# Patient Record
Sex: Male | Born: 1990 | Race: Black or African American | Hispanic: No | Marital: Single | Smoking: Never smoker
Health system: Southern US, Community
[De-identification: ages and names within clinical notes are randomized; demographics above are authoritative.]

## PROBLEM LIST (undated history)

## (undated) DIAGNOSIS — J45909 Unspecified asthma, uncomplicated: Secondary | ICD-10-CM

---
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
## 2010-07-21 ENCOUNTER — Emergency Department (HOSPITAL_COMMUNITY): Admission: EM | Admit: 2010-07-21 | Discharge: 2010-07-21 | Payer: Self-pay | Admitting: Emergency Medicine

## 2011-01-26 NOTE — Procedures (Signed)
Edward Melendez, Edward Melendez                  ACCOUNT NO.:  [id number]   MEDICAL RECORD NO.:  [id number]          PATIENT TYPE:  OUT   LOCATION:  SLEEP CENTER                 FACILITY:  [facility]   PHYSICIAN:  Clinton D. Maple Hudson, M.D. DATE OF BIRTH:  February 20, 1991   DATE OF STUDY:  06/23/2004                              NOCTURNAL POLYSOMNOGRAM   STUDY DATE:  June 23, 2004   REFERRING PHYSICIAN:  Talmadge Coventry, M.D.   INDICATION FOR THE STUDY:  Insomnia with sleep apnea.  Complaints of waking  up early in the morning and coughing in sleep.  Sleep talking.   EPWORTH SLEEPINESS SCORE:  8/24   BODY MASS INDEX:  31.9   WEIGHT:  176 pounds (age 20)   NECK SIZE:  14 inches   SLEEP ARCHITECTURE:  Pediatric scoring criteria were used.  Total sleep time  365 minutes with sleep efficiency 70%.  Stage I was absent, stage II was  34%, stages III and IV were 58%, appropriate for age, REM was 8% of total  sleep time, sleep latency was 52 minutes, REM latency was 218 minutes, awake  after sleep onset 82 minutes, arousal index 9.5.   RESPIRATORY DATA:  RDI 3/hr reflecting 18 hypopneas.  This may be borderline  abnormal at age 68, depending on clinical context, but is most likely within  normal limits.  Events were not positional.  REM RDI was absent.   OXYGEN DATA:  Loud snoring with oxygen desaturation indicated once at 39%  which is an artifact.  Mean oxygen saturation through the study was 97-98%  on room air.   CARDIAC DATA:  Normal sinus rhythm.   MOVEMENT/PARASOMNIA:  Bathroom x1.  A total of 108 limb jerks were recorded  of which 6 were associated with arousal or awakening for a periodic limb  movement with arousal index of 1/hr which is unlikely to be significant.  Technician noted that patient tossed and turned a lot during the study.   IMPRESSION/RECOMMENDATION:  A respiratory disturbance index of 3/hr is  probably within normal limits at age 73.  However, in association with loud  snoring, despite normal oxygenation, the possibility of upper airway  resistance syndrome (subclinical sleep apnea type interference with sleep)  is not excluded.  Continuous positive airway pressure is not usually  provided for an respiratory disturbance index in this range but attention to  good sleep  hygiene, weight loss and evaluation for upper airway obstruction/allergic  rhinitis and tonsil hypertrophy may be indicated.      CDY/MEDQ  D:  06/25/2004 13:52:49  T:  06/26/2004 40:98:11  Job:  [id number]

## 2011-01-26 NOTE — Op Note (Signed)
NAMEMJ, WILLIS                  ACCOUNT NO.:  [id number]   MEDICAL RECORD NO.:  [id number]          PATIENT TYPE:  AMB   LOCATION:  DSC                          FACILITY:  [facility]   PHYSICIAN:  Christopher E. Ezzard Standing, M.D.DATE OF BIRTH:  1991-09-03   DATE OF PROCEDURE:  08/29/2004  DATE OF DISCHARGE:                                 OPERATIVE REPORT   PREOPERATIVE DIAGNOSIS:  Adenotonsillar hypertrophy with obstructive  symptoms.   POSTOPERATIVE DIAGNOSIS:  Adenotonsillar hypertrophy with obstructive  symptoms.   OPERATION:  Tonsillectomy and adenoidectomy.   SURGEON:  Kristine Garbe. Ezzard Standing, M.D.   ANESTHESIA:  General.   COMPLICATIONS:  None.   BRIEF CLINICAL NOTE:  Jovan is a 20 year old male with poor sleeping with  snoring.  He underwent a sleep test which did not demonstrate sleep apnea  but was consistent with upper airway resistance syndrome.  On examination,  he has moderate size 2+ tonsils with moderate size adenoids.  He is taken to  the operating room at this time for tonsillectomy and adenoidectomy to  improve his upper airway obstruction.   DESCRIPTION OF PROCEDURE:  After adequate endotracheal anesthesia, the  patient received 1 gram Ancef IV preoperatively as well as 10 mg Decadron IV  preoperatively.  The mouth gag was used to expose the oropharynx.  Sharmarke  has large embedded tonsils bilaterally. Using cautery, the tonsils were  resected from the tonsillar fossae.  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  After obtaining adequate hemostasis, a red rubber catheter was passed  through the nose and out the mouth to retract the soft palate.  The  nasopharynx was examined.  Using an adenoid  curet, the central pad of  adenoid tissue was removed.  Hemostasis was obtained with suction cautery.  After obtaining adequate hemostasis, the nasopharynx was irrigated with  saline.  Of note, Edword had swollen turbinates consistent with rhinitis  causing partial nasal  obstruction.  After obtaining adequate hemostasis, the  procedure was completed.  Jacqueline was awakened from anesthesia and  transferred to the recovery room postop doing well.   DISPOSITION:  Hodari will be observed overnight in the recovery care center  and discharged home in the morning on Amoxicillin suspension 500 mg b.i.d.  for one week, Tylenol Lortab Elixir, 2-3 tsp q.4h. p.r.n. pain.  He is to  follow up in my office in two weeks for recheck.       CEN/MEDQ  D:  08/29/2004  T:  08/29/2004  Job:  [id number]   cc:   Talmadge Coventry, M.D.  [address]  [place]  [place] [address]  Fax: [phone]

## 2011-05-23 ENCOUNTER — Inpatient Hospital Stay (INDEPENDENT_AMBULATORY_CARE_PROVIDER_SITE_OTHER)
Admission: RE | Admit: 2011-05-23 | Discharge: 2011-05-23 | Disposition: A | Discharge status: Home / Self Care | Payer: Medicaid Other | Source: Ambulatory Visit | Attending: Family Medicine | Admitting: Family Medicine

## 2011-05-23 DIAGNOSIS — D1039 Benign neoplasm of other parts of mouth: Secondary | ICD-10-CM

## 2013-01-01 ENCOUNTER — Emergency Department (HOSPITAL_COMMUNITY)
Admission: EM | Admit: 2013-01-01 | Discharge: 2013-01-01 | Disposition: A | Discharge status: Home / Self Care | Payer: No Typology Code available for payment source | Attending: Emergency Medicine | Admitting: Emergency Medicine

## 2013-01-01 ENCOUNTER — Emergency Department (HOSPITAL_COMMUNITY): Payer: No Typology Code available for payment source

## 2013-01-01 ENCOUNTER — Encounter (HOSPITAL_COMMUNITY): Payer: Self-pay | Admitting: Emergency Medicine

## 2013-01-01 DIAGNOSIS — M542 Cervicalgia: Secondary | ICD-10-CM

## 2013-01-01 DIAGNOSIS — J45909 Unspecified asthma, uncomplicated: Secondary | ICD-10-CM | POA: Insufficient documentation

## 2013-01-01 DIAGNOSIS — Y939 Activity, unspecified: Secondary | ICD-10-CM | POA: Insufficient documentation

## 2013-01-01 DIAGNOSIS — IMO0002 Reserved for concepts with insufficient information to code with codable children: Secondary | ICD-10-CM | POA: Insufficient documentation

## 2013-01-01 DIAGNOSIS — S0993XA Unspecified injury of face, initial encounter: Secondary | ICD-10-CM | POA: Insufficient documentation

## 2013-01-01 DIAGNOSIS — Y92410 Unspecified street and highway as the place of occurrence of the external cause: Secondary | ICD-10-CM | POA: Insufficient documentation

## 2013-01-01 DIAGNOSIS — M545 Low back pain: Secondary | ICD-10-CM

## 2013-01-01 DIAGNOSIS — S199XXA Unspecified injury of neck, initial encounter: Secondary | ICD-10-CM | POA: Insufficient documentation

## 2013-01-01 HISTORY — DX: Unspecified asthma, uncomplicated: J45.909
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
## 2013-05-30 ENCOUNTER — Emergency Department (HOSPITAL_COMMUNITY)
Admission: EM | Admit: 2013-05-30 | Discharge: 2013-05-30 | Disposition: A | Discharge status: Home / Self Care | Payer: Medicaid Other | Attending: Emergency Medicine | Admitting: Emergency Medicine

## 2013-05-30 ENCOUNTER — Encounter (HOSPITAL_COMMUNITY): Payer: Self-pay | Admitting: Emergency Medicine

## 2013-05-30 DIAGNOSIS — J3489 Other specified disorders of nose and nasal sinuses: Secondary | ICD-10-CM | POA: Insufficient documentation

## 2013-05-30 DIAGNOSIS — IMO0001 Reserved for inherently not codable concepts without codable children: Secondary | ICD-10-CM | POA: Insufficient documentation

## 2013-05-30 DIAGNOSIS — J45909 Unspecified asthma, uncomplicated: Secondary | ICD-10-CM | POA: Insufficient documentation

## 2013-05-30 DIAGNOSIS — R509 Fever, unspecified: Secondary | ICD-10-CM | POA: Insufficient documentation

## 2013-05-30 DIAGNOSIS — J069 Acute upper respiratory infection, unspecified: Secondary | ICD-10-CM

## 2013-05-30 DIAGNOSIS — R0602 Shortness of breath: Secondary | ICD-10-CM | POA: Insufficient documentation

## 2013-05-30 MED ORDER — IBUPROFEN 200 MG PO TABS
600.0000 mg | ORAL_TABLET | Freq: Once | ORAL | Status: AC
  Administered 2013-05-30: 600 mg via ORAL
  Filled 2013-05-30: qty 3

## 2013-05-30 NOTE — ED Notes (Signed)
Symptoms started yesterday, sudden onset with leg/back pain, fever, chills, near syncope yesterday, intermittent sob. Denies CP, weakness.Temp 99.5 in room. In not distress. Lungs clear. Sinus Tachy on monitor.

## 2013-05-30 NOTE — ED Notes (Signed)
Pt c/o of cough, dizziness, and fever that started yesterday. States that he took TheraFlu with some relief. Denies n/v/d.

## 2013-05-30 NOTE — ED Provider Notes (Signed)
CSN: [id number]     Arrival date & time 05/30/13  1058 History   First MD Initiated Contact with Patient 05/30/13 1113     Chief Complaint  Patient presents with  . Chills  . Shortness of Breath  . Cough   (Consider location/radiation/quality/duration/timing/severity/associated sxs/prior Treatment) HPI Comments: Pt with congestion, achiness, mild dry cough.  Theraflu helped his symptoms last night for a few hours, but has come back.  No sig SOB.  No N/V/D.    Patient is a 22 y.o. male presenting with cough. The history is provided by the patient.  Cough Cough characteristics:  Non-productive Severity:  Mild Onset quality:  Gradual Duration:  1 day Timing:  Constant Progression:  Waxing and waning Chronicity:  New Smoker: no   Context: upper respiratory infection   Relieved by:  Cough suppressants Worsened by:  Nothing tried Associated symptoms: chills, fever, rhinorrhea and sinus congestion   Associated symptoms: no rash and no sore throat     Past Medical History  Diagnosis Date  . Asthma    History reviewed. No pertinent past surgical history. History reviewed. No pertinent family history. History  Substance Use Topics  . Smoking status: Not on file  . Smokeless tobacco: Not on file  . Alcohol Use: No    Review of Systems  Constitutional: Positive for fever and chills.  HENT: Positive for rhinorrhea. Negative for sore throat.   Respiratory: Positive for cough.   Skin: Negative for rash.  All other systems reviewed and are negative.    Allergies  Review of patient's allergies indicates no known allergies.  Home Medications   Current Outpatient Rx  Name  Route  Sig  Dispense  Refill  . Chlorphen-Pseudoephed-APAP (THERAFLU FLU/COLD PO)   Oral   Take 1 packet by mouth as needed (for cold and cough). Mix with water          BP 157/98  Pulse 56  Temp(Src) 101.4 F (38.6 C) (Oral)  Resp 16  SpO2 100% Physical Exam  Nursing note and vitals  reviewed. Constitutional: He is oriented to person, place, and time. He appears well-developed and well-nourished. No distress.  HENT:  Head: Normocephalic and atraumatic.  Nose: Right sinus exhibits no maxillary sinus tenderness and no frontal sinus tenderness. Left sinus exhibits no maxillary sinus tenderness and no frontal sinus tenderness.  Mouth/Throat: Oropharynx is clear and moist.  Eyes: Conjunctivae and EOM are normal.  Neck: Normal range of motion and full passive range of motion without pain. Neck supple. No spinous process tenderness present. No Brudzinski's sign and no Kernig's sign noted.  Cardiovascular: Normal rate, regular rhythm and intact distal pulses.   No murmur heard. Pulmonary/Chest: Effort normal. No respiratory distress. He has no wheezes. He has no rales. He exhibits no tenderness.  Abdominal: Soft. He exhibits no distension. There is no tenderness.  Lymphadenopathy:    He has no cervical adenopathy.  Neurological: He is alert and oriented to person, place, and time. He exhibits normal muscle tone. Coordination normal.  Skin: Skin is warm. No rash noted. He is not diaphoretic.    ED Course  Procedures (including critical care time) Labs Review Labs Reviewed - No data to display Imaging Review No results found.  MDM   1. Upper respiratory infection    Pt appears well, febrile, has myalgias, mild congestion, dry cough.  Pt likely with URI, not flu season yet, pt is not toxic appearing.  Pt is reassured, can continue NSAIDs, Theraflu at  home, rest, push fluids.  Pt is in agreement with plan.      Gavin Pound. Ameria Sanjurjo, MD 05/30/13 1141

## 2013-05-30 NOTE — Discharge Instructions (Signed)

## 2013-09-17 ENCOUNTER — Encounter (HOSPITAL_COMMUNITY): Payer: Self-pay | Admitting: Emergency Medicine

## 2013-09-17 ENCOUNTER — Emergency Department (HOSPITAL_COMMUNITY)
Admission: EM | Admit: 2013-09-17 | Discharge: 2013-09-17 | Disposition: A | Discharge status: Home / Self Care | Payer: Medicaid Other | Attending: Emergency Medicine | Admitting: Emergency Medicine

## 2013-09-17 DIAGNOSIS — R0789 Other chest pain: Secondary | ICD-10-CM | POA: Insufficient documentation

## 2013-09-17 DIAGNOSIS — J45909 Unspecified asthma, uncomplicated: Secondary | ICD-10-CM | POA: Insufficient documentation

## 2013-09-17 DIAGNOSIS — R079 Chest pain, unspecified: Secondary | ICD-10-CM

## 2013-09-17 LAB — BASIC METABOLIC PANEL
BUN: 8 mg/dL (ref 6–23)
CHLORIDE: 101 meq/L (ref 96–112)
CO2: 29 mEq/L (ref 19–32)
CREATININE: 0.92 mg/dL (ref 0.50–1.35)
Calcium: 10.4 mg/dL (ref 8.4–10.5)
GFR calc non Af Amer: >90 mL/min (ref >90)
Glucose, Bld: 86 mg/dL (ref 70–99)
Potassium: 4.3 mEq/L (ref 3.7–5.3)
SODIUM: 141 meq/L (ref 137–147)

## 2013-09-17 LAB — CBC
HCT: 40.6 % (ref 39.0–52.0)
Hemoglobin: 13.8 g/dL (ref 13.0–17.0)
MCH: 27.4 pg (ref 26.0–34.0)
MCHC: 34 g/dL (ref 30.0–36.0)
MCV: 80.7 fL (ref 78.0–100.0)
PLATELETS: 367 10*3/uL (ref 150–400)
RBC: 5.03 MIL/uL (ref 4.22–5.81)
RDW: 13.9 % (ref 11.5–15.5)
WBC: 9.6 10*3/uL (ref 4.0–10.5)

## 2013-09-17 LAB — POCT I-STAT TROPONIN I: TROPONIN I, POC: 0 ng/mL (ref 0.00–0.08)

## 2013-09-17 LAB — D-DIMER, QUANTITATIVE: D-Dimer, Quant: 0.36 ug/mL-FEU (ref 0.00–0.48)

## 2013-09-17 MED ORDER — NAPROXEN 500 MG PO TABS: 500.0000 mg | ORAL_TABLET | Freq: Two times a day (BID) | ORAL | Status: DC

## 2013-09-17 NOTE — ED Provider Notes (Signed)
CSN: [id number]     Arrival date & time 09/17/13  1721 History  First MD Initiated Contact with Patient 09/17/13 1846     Chief Complaint  Patient presents with  . Chest Pain   HPI Patient presents to the emergency room with complaints of chest pain. Patient states about a week ago he had a syncopal episode. This occurred after an episode of emotional upset weight and got hot and passed out. Patient was evaluated by EMS at home. He did not seek any further medical attention. Patient states since that time he's had intermittent episodes chest pain. Today's had more discomfort. The pain is aching in his chest. It does not radiate. He denies any shortness of breath dizziness diaphoresis or nausea. Slight cough recently but otherwise no other significant symptoms. No history of cardiac disease. No history of pulmonary and was him. Patient denies any significant family history. Denies any drugs or alcohol use Past Medical History  Diagnosis Date  . Asthma    History reviewed. No pertinent past surgical history. No family history on file. History  Substance Use Topics  . Smoking status: Never Smoker   . Smokeless tobacco: Not on file  . Alcohol Use: No    Review of Systems  All other systems reviewed and are negative.    Allergies  Review of patient's allergies indicates no known allergies.  Home Medications   Current Outpatient Rx  Name  Route  Sig  Dispense  Refill  . naproxen (NAPROSYN) 500 MG tablet   Oral   Take 1 tablet (500 mg total) by mouth 2 (two) times daily.   30 tablet   0    BP 118/86  Pulse 77  Temp(Src) 98.6 F (37 C) (Oral)  Resp 16  SpO2 99% Physical Exam  Nursing note and vitals reviewed. Constitutional: He appears well-developed and well-nourished. No distress.  HENT:  Head: Normocephalic and atraumatic.  Right Ear: External ear normal.  Left Ear: External ear normal.  Eyes: Conjunctivae are normal. Right eye exhibits no discharge. Left eye exhibits  no discharge. No scleral icterus.  Neck: Neck supple. No tracheal deviation present.  Cardiovascular: Normal rate, regular rhythm and intact distal pulses.   Pulmonary/Chest: Effort normal and breath sounds normal. No stridor. No respiratory distress. He has no wheezes. He has no rales.  Abdominal: Soft. Bowel sounds are normal. He exhibits no distension. There is no tenderness. There is no rebound and no guarding.  Musculoskeletal: He exhibits no edema and no tenderness.  Neurological: He is alert. He has normal strength. No sensory deficit. Cranial nerve deficit:  no gross defecits noted. He exhibits normal muscle tone. He displays no seizure activity. Coordination normal.  Skin: Skin is warm and dry. No rash noted.  Psychiatric: He has a normal mood and affect.    ED Course  Procedures (including critical care time) Labs Review Labs Reviewed  CBC  BASIC METABOLIC PANEL  D-DIMER, QUANTITATIVE  POCT I-STAT TROPONIN I   Imaging Review No results found.  EKG Interpretation    Date/Time:  Thursday September 17 2013 18:31:26 EST Ventricular Rate:  78 PR Interval:  174 QRS Duration: 80 QT Interval:  363 QTC Calculation: 413 R Axis:   19 Text Interpretation:  Sinus rhythm ST elev, probable normal early repol pattern No previous tracing Confirmed by Raniyah Curenton  MD-J, Diego Delancey ([id number]) on 09/17/2013 8:57:05 PM            MDM   1. Chest pain  Etiology of his chest pain unclear.  Doubt cardiac etiology, PE. At this time there does not appear to be any evidence of an acute emergency medical condition and the patient appears stable for discharge with appropriate outpatient follow up.    Kathalene Frames, MD 09/17/13 [phone]

## 2013-09-17 NOTE — Discharge Instructions (Signed)
Chest Pain (Nonspecific) °It is often hard to give a specific diagnosis for the cause of chest pain. There is always a chance that your pain could be related to something serious, such as a heart attack or a blood clot in the lungs. You need to follow up with your caregiver for further evaluation. °CAUSES  °· Heartburn. °· Pneumonia or bronchitis. °· Anxiety or stress. °· Inflammation around your heart (pericarditis) or lung (pleuritis or pleurisy). °· A blood clot in the lung. °· A collapsed lung (pneumothorax). It can develop suddenly on its own (spontaneous pneumothorax) or from injury (trauma) to the chest. °· Shingles infection (herpes zoster virus). °The chest wall is composed of bones, muscles, and cartilage. Any of these can be the source of the pain. °· The bones can be bruised by injury. °· The muscles or cartilage can be strained by coughing or overwork. °· The cartilage can be affected by inflammation and become sore (costochondritis). °DIAGNOSIS  °Lab tests or other studies, such as X-rays, electrocardiography, stress testing, or cardiac imaging, may be needed to find the cause of your pain.  °TREATMENT  °· Treatment depends on what may be causing your chest pain. Treatment may include: °· Acid blockers for heartburn. °· Anti-inflammatory medicine. °· Pain medicine for inflammatory conditions. °· Antibiotics if an infection is present. °· You may be advised to change lifestyle habits. This includes stopping smoking and avoiding alcohol, caffeine, and chocolate. °· You may be advised to keep your head raised (elevated) when sleeping. This reduces the chance of acid going backward from your stomach into your esophagus. °· Most of the time, nonspecific chest pain will improve within 2 to 3 days with rest and mild pain medicine. °HOME CARE INSTRUCTIONS  °· If antibiotics were prescribed, take your antibiotics as directed. Finish them even if you start to feel better. °· For the next few days, avoid physical  activities that bring on chest pain. Continue physical activities as directed. °· Do not smoke. °· Avoid drinking alcohol. °· Only take over-the-counter or prescription medicine for pain, discomfort, or fever as directed by your caregiver. °· Follow your caregiver's suggestions for further testing if your chest pain does not go away. °· Keep any follow-up appointments you made. If you do not go to an appointment, you could develop lasting (chronic) problems with pain. If there is any problem keeping an appointment, you must call to reschedule. °SEEK MEDICAL CARE IF:  °· You think you are having problems from the medicine you are taking. Read your medicine instructions carefully. °· Your chest pain does not go away, even after treatment. °· You develop a rash with blisters on your chest. °SEEK IMMEDIATE MEDICAL CARE IF:  °· You have increased chest pain or pain that spreads to your arm, neck, jaw, back, or abdomen. °· You develop shortness of breath, an increasing cough, or you are coughing up blood. °· You have severe back or abdominal pain, feel nauseous, or vomit. °· You develop severe weakness, fainting, or chills. °· You have a fever. °THIS IS AN EMERGENCY. Do not wait to see if the pain will go away. Get medical help at once. Call your local emergency services (911 in [place] Do not drive yourself to the hospital. °MAKE SURE YOU:  °· Understand these instructions. °· Will watch your condition. °· Will get help right away if you are not doing well or get worse. °Document Released: 06/06/2005 Document Revised: 11/19/2011 Document Reviewed: 04/01/2008 °[employer]® Patient Information ©2014 [employer]   [employer]. ° °

## 2013-09-17 NOTE — ED Notes (Signed)
Pt from home reports that he got upset approx 1 week ago, "got hot and passed out." Pt was seen by EMS,  But was not transported at that time. Pt states that he has had CP since, but more today. Pt denies SOB, dizziness, diaphoresis, nausea. Pt adds that he has had cough x2 days from a cold he had. Pt is A&O and in NAD

## 2013-09-17 NOTE — ED Notes (Signed)
Gave EKG to Edward Melendez, EDP. She stated non-stemi

## 2013-09-17 NOTE — ED Notes (Signed)
Bed: WA20 Expected date:  Expected time:  Means of arrival:  Comments: EMS-FALL

## 2013-12-18 ENCOUNTER — Emergency Department (HOSPITAL_COMMUNITY)
Admission: EM | Admit: 2013-12-18 | Discharge: 2013-12-18 | Disposition: A | Discharge status: Home / Self Care | Payer: Medicaid Other | Attending: Emergency Medicine | Admitting: Emergency Medicine

## 2013-12-18 ENCOUNTER — Encounter (HOSPITAL_COMMUNITY): Payer: Self-pay | Admitting: Emergency Medicine

## 2013-12-18 DIAGNOSIS — H6120 Impacted cerumen, unspecified ear: Secondary | ICD-10-CM | POA: Insufficient documentation

## 2013-12-18 DIAGNOSIS — H6090 Unspecified otitis externa, unspecified ear: Secondary | ICD-10-CM

## 2013-12-18 DIAGNOSIS — J45909 Unspecified asthma, uncomplicated: Secondary | ICD-10-CM | POA: Insufficient documentation

## 2013-12-18 DIAGNOSIS — H60399 Other infective otitis externa, unspecified ear: Secondary | ICD-10-CM | POA: Insufficient documentation

## 2013-12-18 MED ORDER — ANTIPYRINE-BENZOCAINE 5.4-1.4 % OT SOLN: 3.0000 [drp] | OTIC | Status: DC | PRN

## 2013-12-18 MED ORDER — CARBAMIDE PEROXIDE 6.5 % OT SOLN: 5.0000 [drp] | Freq: Two times a day (BID) | OTIC | Status: DC

## 2013-12-18 MED ORDER — NEOMYCIN-POLYMYXIN-HC 3.5-10000-1 OT SUSP: 4.0000 [drp] | Freq: Four times a day (QID) | OTIC | Status: DC
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
## 2013-12-18 NOTE — ED Notes (Signed)
Per pt, difficulty hearing out of left ear.  Started 3 days ago.  No fever or congestion.

## 2013-12-18 NOTE — ED Provider Notes (Signed)
CSN: [id number]     Arrival date & time 12/18/13  4403 History   First MD Initiated Contact with Patient 12/18/13 0725     Chief Complaint  Patient presents with  . Ear Fullness     (Consider location/radiation/quality/duration/timing/severity/associated sxs/prior Treatment) The history is provided by the patient.    Pt reports left ear feeling "clogged" with hearing decreased x 3 days.  Attempted to "pop ears" by pinching nose and blowing area against closed mouth and nose without improvement.  Also used Qtips without improvement.  Had mild rhinorrhea a few days ago that resolved.  Denies fevers, ear discharge, ear pain, sinus pain, sore throat, nasal congestion.  Has not been swimming recently.  Had sick contact in sister with URI symptoms.    Past Medical History  Diagnosis Date  . Asthma    History reviewed. No pertinent past surgical history. History reviewed. No pertinent family history. History  Substance Use Topics  . Smoking status: Never Smoker   . Smokeless tobacco: Not on file  . Alcohol Use: No    Review of Systems  Constitutional: Negative for fever and chills.  HENT: Negative for congestion, ear discharge, ear pain, sneezing, sore throat and trouble swallowing.   Respiratory: Negative for shortness of breath.   All other systems reviewed and are negative.     Allergies  Review of patient's allergies indicates no known allergies.  Home Medications  No current outpatient prescriptions on file. BP 133/79  Pulse 80  Temp(Src) 98.5 F (36.9 C) (Oral)  Resp 16  SpO2 100% Physical Exam  Nursing note and vitals reviewed. Constitutional: He appears well-developed and well-nourished. No distress.  HENT:  Head: Normocephalic and atraumatic.  Right Ear: Tympanic membrane and ear canal normal.  Left ear canal with cerumen impaction.  Canal without erythema or edema.   Neck: Neck supple.  Pulmonary/Chest: Effort normal.  Neurological: He is alert.  Skin: He  is not diaphoretic.    ED Course  EAR CERUMEN REMOVAL Date/Time: 12/18/2013 3:03 PM Performed by: Clayton Bibles Authorized by: Clayton Bibles Consent: Verbal consent obtained. Consent given by: patient Patient understanding: patient states understanding of the procedure being performed Patient identity confirmed: verbally with patient Local anesthetic: none Location details: left ear Procedure type: curette and irrigation Patient sedated: no Patient tolerance: Patient tolerated the procedure well with no immediate complications. Comments: Large amount of cerumen removed.  Some cerumen remained but it is softened and pt feeling much better.     (including critical care time) Labs Review Labs Reviewed - No data to display Imaging Review No results found.   EKG Interpretation None      MDM   Final diagnoses:  Cerumen impaction  Otitis externa    Pt with left ear cerumen impaction.  No fever, no URI symptoms.  Multiple attempts at irrigation by nurse and curette by me with improvement.  Pt's hearing improved, still had some cerumen that was deep.  Pt d/c home with treatment for otitis externa, auralgan for pain, and continued treatment for impaction with debrox.  ENT follow up if no improvement.  Discussed findings, treatment, and follow up  with patient.  Pt given return precautions.  Pt verbalizes understanding and agrees with plan.        [place], PA-C 12/18/13 [phone]

## 2013-12-18 NOTE — Progress Notes (Signed)
[facility] provided pt with a list of self-pay PCP to help patient establish primary care.

## 2013-12-18 NOTE — Discharge Instructions (Signed)
Read the information below.  Use the prescribed medication as directed.  Please discuss all new medications with your pharmacist.  You may return to the Emergency Department at any time for worsening condition or any new symptoms that concern you.  If you develop fevers, uncontrolled pain return to the ER for a recheck.     Cerumen Impaction A cerumen impaction is when the wax in your ear forms a plug. This plug usually causes reduced hearing. Sometimes it also causes an earache or dizziness. Removing a cerumen impaction can be difficult and painful. The wax sticks to the ear canal. The canal is sensitive and bleeds easily. If you try to remove a heavy wax buildup with a cotton tipped swab, you may push it in further. Irrigation with water, suction, and small ear curettes may be used to clear out the wax. If the impaction is fixed to the skin in the ear canal, ear drops may be needed for a few days to loosen the wax. People who build up a lot of wax frequently can use ear wax removal products available in your local drugstore. SEEK MEDICAL CARE IF:  You develop an earache, increased hearing loss, or marked dizziness. Document Released: 10/04/2004 Document Revised: 11/19/2011 Document Reviewed: 11/24/2009 [facility] Patient Information 2014 [place], [place].

## 2013-12-21 NOTE — ED Provider Notes (Signed)
Medical screening examination/treatment/procedure(s) were performed by non-physician practitioner and as supervising physician I was immediately available for consultation/collaboration.   EKG Interpretation None        Ephraim Hamburger, MD 12/21/13 1718

## 2014-03-14 ENCOUNTER — Other Ambulatory Visit: Payer: Self-pay

## 2014-03-14 ENCOUNTER — Encounter (HOSPITAL_COMMUNITY): Payer: Self-pay | Admitting: Emergency Medicine

## 2014-03-14 DIAGNOSIS — R55 Syncope and collapse: Secondary | ICD-10-CM | POA: Insufficient documentation

## 2014-03-14 DIAGNOSIS — G8929 Other chronic pain: Secondary | ICD-10-CM | POA: Insufficient documentation

## 2014-03-14 DIAGNOSIS — R51 Headache: Secondary | ICD-10-CM | POA: Insufficient documentation

## 2014-03-14 DIAGNOSIS — J45909 Unspecified asthma, uncomplicated: Secondary | ICD-10-CM | POA: Insufficient documentation

## 2014-03-14 LAB — I-STAT CHEM 8, ED
BUN: 12 mg/dL (ref 6–23)
CALCIUM ION: 1.25 mmol/L — AB (ref 1.12–1.23)
Chloride: 102 mEq/L (ref 96–112)
Creatinine, Ser: 0.9 mg/dL (ref 0.50–1.35)
Glucose, Bld: 108 mg/dL — ABNORMAL HIGH (ref 70–99)
HCT: 45 % (ref 39.0–52.0)
Hemoglobin: 15.3 g/dL (ref 13.0–17.0)
Potassium: 3.4 mEq/L — ABNORMAL LOW (ref 3.7–5.3)
Sodium: 143 mEq/L (ref 137–147)
TCO2: 28 mmol/L (ref 0–100)

## 2014-03-14 NOTE — ED Notes (Signed)
Pt states he was just upstairs visiting his Grandmother and had syncopal episode while getting on elevator.  Denies LOC.  States he felt dizzy and fell down. Denies injury from fall. Reports headache x 3 hours.  Pt reports history of headaches everyday.  No neuro deficits noted on triage exam.

## 2014-03-15 ENCOUNTER — Emergency Department (HOSPITAL_COMMUNITY)
Admission: EM | Admit: 2014-03-15 | Discharge: 2014-03-15 | Disposition: A | Discharge status: Home / Self Care | Payer: Medicaid Other | Attending: Emergency Medicine | Admitting: Emergency Medicine

## 2014-03-15 DIAGNOSIS — R55 Syncope and collapse: Secondary | ICD-10-CM

## 2014-03-15 DIAGNOSIS — R51 Headache: Secondary | ICD-10-CM

## 2014-03-15 DIAGNOSIS — G8929 Other chronic pain: Secondary | ICD-10-CM

## 2014-03-15 LAB — CBC WITH DIFFERENTIAL/PLATELET
BASOS ABS: 0.1 10*3/uL (ref 0.0–0.1)
BASOS PCT: 1 % (ref 0–1)
Eosinophils Absolute: 0.2 10*3/uL (ref 0.0–0.7)
Eosinophils Relative: 3 % (ref 0–5)
HCT: 36.6 % — ABNORMAL LOW (ref 39.0–52.0)
Hemoglobin: 12 g/dL — ABNORMAL LOW (ref 13.0–17.0)
LYMPHS PCT: 45 % (ref 12–46)
Lymphs Abs: 3.7 10*3/uL (ref 0.7–4.0)
MCH: 27 pg (ref 26.0–34.0)
MCHC: 32.8 g/dL (ref 30.0–36.0)
MCV: 82.2 fL (ref 78.0–100.0)
Monocytes Absolute: 1 10*3/uL (ref 0.1–1.0)
Monocytes Relative: 11 % (ref 3–12)
Neutro Abs: 3.4 10*3/uL (ref 1.7–7.7)
Neutrophils Relative %: 41 % — ABNORMAL LOW (ref 43–77)
PLATELETS: 309 10*3/uL (ref 150–400)
RBC: 4.45 MIL/uL (ref 4.22–5.81)
RDW: 14 % (ref 11.5–15.5)
WBC: 8.3 10*3/uL (ref 4.0–10.5)

## 2014-03-15 LAB — CBG MONITORING, ED: Glucose-Capillary: 104 mg/dL — ABNORMAL HIGH (ref 70–99)

## 2014-03-15 MED ORDER — SODIUM CHLORIDE 0.9 % IV BOLUS (SEPSIS)
1000.0000 mL | Freq: Once | INTRAVENOUS | Status: AC
  Administered 2014-03-15: 1000 mL via INTRAVENOUS

## 2014-03-15 NOTE — ED Provider Notes (Addendum)
CSN: [id number]     Arrival date & time 03/14/14  2200 History   First MD Initiated Contact with Patient 03/15/14 0009     Chief Complaint  Patient presents with  . Near Syncope     (Consider location/radiation/quality/duration/timing/severity/associated sxs/prior Treatment) HPI This is a 23 year old male who has had about a 5 year history of daily headaches. The headaches are primarily frontal and seemed to have no trigger. They vary in severity and several years ago for severe enough that he had to stay in a dark room he is the pain. He does not take anything for these headaches.   He is here now because he had a near syncopal episode on the hospital elevator just prior to checking into the ED. He had been visiting his grandmother who was recently started on dialysis. He admits to being under stress and concern over her illness. The near syncopal episode was preceded by lightheadedness and generalized muscle cramping. There was no associated nausea, vomiting, diarrhea, diaphoresis, chest pain, shortness of breath, palpitations or loss of consciousness. He is returned to his baseline now. He has a headache now but is not severe and is consistent with the headaches he has been having for years. There is no focal neurologic deficit.  Past Medical History  Diagnosis Date  . Asthma    History reviewed. No pertinent past surgical history. No family history on file. History  Substance Use Topics  . Smoking status: Never Smoker   . Smokeless tobacco: Not on file  . Alcohol Use: No    Review of Systems  All other systems reviewed and are negative.  Allergies  Review of patient's allergies indicates no known allergies.  Home Medications   Prior to Admission medications   Not on File   BP 111/67  Pulse 61  Temp(Src) 98.9 F (37.2 C) (Oral)  Resp 14  SpO2 99%  Physical Exam General: Well-developed, well-nourished male in no acute distress; appearance consistent with age of  record HENT: normocephalic; atraumatic Eyes: pupils equal, round and reactive to light; extraocular muscles intact Neck: supple Heart: regular rate and rhythm; no ectopy Lungs: clear to auscultation bilaterally Abdomen: soft; nondistended; nontender; no masses or hepatosplenomegaly; bowel sounds present Extremities: No deformity; full range of motion; pulses normal Neurologic: Awake, alert and oriented; motor function intact in all extremities and symmetric; no facial droop; normal finger to nose; negative Romberg; normal coordination and speech Skin: Warm and dry Psychiatric: Normal mood and affect    ED Course  Procedures (including critical care time)  MDM    Date: 03/14/2014 22:06 AM  Rate: 88  Rhythm: normal sinus rhythm  QRS Axis: normal  Intervals: normal  ST/T Wave abnormalities: normal  Conduction Disutrbances: none  Narrative Interpretation: unremarkable  Comparison with previous EKG: no significant changes   Nursing notes and vitals signs, including pulse oximetry, reviewed.  Summary of this visit's results, reviewed by myself:  Labs:  Results for orders placed during the hospital encounter of 03/15/14 (from the past 24 hour(s))  I-STAT CHEM 8, ED     Status: Abnormal   Collection Time    03/14/14 10:33 PM      Result Value Ref Range   Sodium 143  137 - 147 mEq/L   Potassium 3.4 (*) 3.7 - 5.3 mEq/L   Chloride 102  96 - 112 mEq/L   BUN 12  6 - 23 mg/dL   Creatinine, Ser 0.90  0.50 - 1.35 mg/dL   Glucose, Bld  108 (*) 70 - 99 mg/dL   Calcium, Ion 1.25 (*) 1.12 - 1.23 mmol/L   TCO2 28  0 - 100 mmol/L   Hemoglobin 15.3  13.0 - 17.0 g/dL   HCT 45.0  39.0 - 52.0 %  CBC WITH DIFFERENTIAL     Status: Abnormal   Collection Time    03/15/14  2:24 AM      Result Value Ref Range   WBC 8.3  4.0 - 10.5 K/uL   RBC 4.45  4.22 - 5.81 MIL/uL   Hemoglobin 12.0 (*) 13.0 - 17.0 g/dL   HCT 36.6 (*) 39.0 - 52.0 %   MCV 82.2  78.0 - 100.0 fL   MCH 27.0  26.0 - 34.0 pg    MCHC 32.8  30.0 - 36.0 g/dL   RDW 14.0  11.5 - 15.5 %   Platelets 309  150 - 400 K/uL   Neutrophils Relative % 41 (*) 43 - 77 %   Neutro Abs 3.4  1.7 - 7.7 K/uL   Lymphocytes Relative 45  12 - 46 %   Lymphs Abs 3.7  0.7 - 4.0 K/uL   Monocytes Relative 11  3 - 12 %   Monocytes Absolute 1.0  0.1 - 1.0 K/uL   Eosinophils Relative 3  0 - 5 %   Eosinophils Absolute 0.2  0.0 - 0.7 K/uL   Basophils Relative 1  0 - 1 %   Basophils Absolute 0.1  0.0 - 0.1 K/uL   4:04 AM Patient asymptomatic at this time. He is given a 1 L IV fluid bolus. He was advised that he needs to find a primary care physician for evaluation of his long-standing history of headaches.     Wynetta Fines, MD 03/15/14 4163  Wynetta Fines, MD 03/15/14 8453

## 2014-03-15 NOTE — Discharge Instructions (Signed)
Near-Syncope Near-syncope (commonly known as near fainting) is sudden weakness, dizziness, or feeling like you might pass out. During an episode of near-syncope, you may also develop pale skin, have tunnel vision, or feel sick to your stomach (nauseous). Near-syncope may occur when getting up after sitting or while standing for a long time. It is caused by a sudden decrease in blood flow to the brain. This decrease can result from various causes or triggers, most of which are not serious. However, because near-syncope can sometimes be a sign of something serious, a medical evaluation is required. The specific cause is often not determined. HOME CARE INSTRUCTIONS  Monitor your condition for any changes. The following actions may help to alleviate any discomfort you are experiencing:  Have someone stay with you until you feel stable.  Lie down right away and prop your feet up if you start feeling like you might faint. Breathe deeply and steadily. Wait until all the symptoms have passed. Most of these episodes last only a few minutes. You may feel tired for several hours.   Drink enough fluids to keep your urine clear or pale yellow.   If you are taking blood pressure or heart medicine, get up slowly when seated or lying down. Take several minutes to sit and then stand. This can reduce dizziness.  Follow up with your health care provider as directed. SEEK IMMEDIATE MEDICAL CARE IF:   You have a severe headache.   You have unusual pain in the chest, abdomen, or back.   You are bleeding from the mouth or rectum, or you have black or tarry stool.   You have an irregular or very fast heartbeat.   You have repeated fainting or have seizure-like jerking during an episode.   You faint when sitting or lying down.   You have confusion.   You have difficulty walking.   You have severe weakness.   You have vision problems.  MAKE SURE YOU:   Understand these instructions.  Will  watch your condition.  Will get help right away if you are not doing well or get worse. Document Released: 08/27/2005 Document Revised: 09/01/2013 Document Reviewed: 01/30/2013 [employer] Patient Information 2015 [employer]. This information is not intended to replace advice given to you by your health care provider. Make sure you discuss any questions you have with your health care provider.  

## 2014-03-23 ENCOUNTER — Emergency Department (HOSPITAL_COMMUNITY)
Admission: EM | Admit: 2014-03-23 | Discharge: 2014-03-23 | Disposition: A | Discharge status: Home / Self Care | Payer: Medicaid Other | Attending: Emergency Medicine | Admitting: Emergency Medicine

## 2014-03-23 ENCOUNTER — Encounter (HOSPITAL_COMMUNITY): Payer: Self-pay | Admitting: Emergency Medicine

## 2014-03-23 DIAGNOSIS — J45909 Unspecified asthma, uncomplicated: Secondary | ICD-10-CM | POA: Insufficient documentation

## 2014-03-23 DIAGNOSIS — R21 Rash and other nonspecific skin eruption: Secondary | ICD-10-CM

## 2014-03-23 MED ORDER — HYDROCORTISONE 2.5 % EX LOTN: TOPICAL_LOTION | Freq: Two times a day (BID) | CUTANEOUS | Status: DC

## 2014-03-23 MED ORDER — HYDROCORTISONE 1 % EX CREA: TOPICAL_CREAM | CUTANEOUS | Status: DC

## 2014-03-23 MED ORDER — LORATADINE 10 MG PO TABS: 10.0000 mg | ORAL_TABLET | Freq: Every day | ORAL | Status: DC

## 2014-03-23 NOTE — Discharge Instructions (Signed)
Use Claritin for allergy symptoms of sneezing and runny nose everyday, Hydrocortisone cream for itching or rash, and continue your usual home medications. Get plenty of rest, drink plenty of fluids, shower with cool water (no hot steamy showers); use soap only on your "bits" (underarms and bottom); use cetaphil cleanser or dove soap when using soap; use eucerin or aquaphor lotion after every bath/shower. Please followup with your primary doctor for discussion of your diagnoses and further evaluation after today's visit; if you do not have a primary care doctor use the resource guide provided to find one; Also follow-up as needed with the Dermatologist as listed in your discharge instructions   Rash A rash is a change in the color or texture of your skin. There are many different types of rashes. You may have other problems that accompany your rash. CAUSES   Infections.  Allergic reactions. This can include allergies to pets or foods.  Certain medicines.  Exposure to certain chemicals, soaps, or cosmetics.  Heat.  Exposure to poisonous plants.  Tumors, both cancerous and noncancerous. SYMPTOMS   Redness.  Scaly skin.  Itchy skin.  Dry or cracked skin.  Bumps.  Blisters.  Pain. DIAGNOSIS  Your caregiver may do a physical exam to determine what type of rash you have. A skin sample (biopsy) may be taken and examined under a microscope. TREATMENT  Treatment depends on the type of rash you have. Your caregiver may prescribe certain medicines. For serious conditions, you may need to see a skin doctor (dermatologist). HOME CARE INSTRUCTIONS   Avoid the substance that caused your rash.  Do not scratch your rash. This can cause infection.  You may take cool baths to help stop itching.  Only take over-the-counter or prescription medicines as directed by your caregiver.  Keep all follow-up appointments as directed by your caregiver. SEEK IMMEDIATE MEDICAL CARE IF:  You have  increasing pain, swelling, or redness.  You have a fever.  You have new or severe symptoms.  You have body aches, diarrhea, or vomiting.  Your rash is not better after 3 days. MAKE SURE YOU:  Understand these instructions.  Will watch your condition.  Will get help right away if you are not doing well or get worse. Document Released: 08/17/2002 Document Revised: 11/19/2011 Document Reviewed: 06/11/2011 [facility] Patient Information 2015 [place], [place]. This information is not intended to replace advice given to you by your health care provider. Make sure you discuss any questions you have with your health care provider.  Emergency Department Resource Guide 1) Find a Doctor and Pay Out of Pocket Although you won't have to find out who is covered by your insurance plan, it is a good idea to ask around and get recommendations. You will then need to call the office and see if the doctor you have chosen will accept you as a new patient and what types of options they offer for patients who are self-pay. Some doctors offer discounts or will set up payment plans for their patients who do not have insurance, but you will need to ask so you aren't surprised when you get to your appointment.  2) Contact Your Local Health Department Not all health departments have doctors that can see patients for sick visits, but many do, so it is worth a call to see if yours does. If you don't know where your local health department is, you can check in your phone book. The CDC also has a tool to help you locate your  state's health department, and many state websites also have listings of all of their local health departments.  3) Find a [facility] If your illness is not likely to be very severe or complicated, you may want to try a walk in clinic. These are popping up all over the country in pharmacies, drugstores, and shopping centers. They're usually staffed by nurse practitioners or physician assistants that  have been trained to treat common illnesses and complaints. They're usually fairly quick and inexpensive. However, if you have serious medical issues or chronic medical problems, these are probably not your best option.  No Primary Care Doctor: - Call Health Connect at  [phone] - they can help you locate a primary care doctor that  accepts your insurance, provides certain services, etc. - Physician Referral Service- [phone]  Chronic Pain Problems: Organization         Address  Phone   Notes  [facility]  [phone] Patients need to be referred by their primary care doctor.   Medication Assistance: Organization         Address  Phone   Notes  [facility] Medication [facility] [place], [place] [address] [phone] --Must be a resident of [facility] -- Must have NO insurance coverage whatsoever (no Medicaid/ Medicare, etc.) -- The pt. MUST have a primary care doctor that directs their care regularly and follows them in the community   MedAssist  [phone]   [employer]  [phone]    Agencies that provide inexpensive medical care: Organization         Address  Phone   Notes  [place]  [phone]   [facility]    [phone]   [facility] [place], [place] [address] [phone]   [place] [address], [place] [phone]   Planned Parenthood    [phone]   [facility]    [phone]   [place] and [address], [place] Phone:  [phone], Fax:  [phone] Hours of Operation:  9 am - 6 pm, M-F.  Also accepts Medicaid/Medicare and self-pay.  [facility] for [place], [address], [place] Phone: [phone], Fax: [phone]. Hours of Operation:  8:30 am - 5:30 pm, M-F.  Also accepts Medicaid and  self-pay.  [facility] [place] [address] [place] Phone: [phone]   [place], [place], [place] [phone], Ext. [phone] Mondays & Thursdays: 7-9 AM.  First 15 patients are seen on a first come, first serve basis.    [place] Providers:  Organization         Address  Phone   Notes  [facility] [address], [place] [phone] Also accepts self-pay patients.  [facility] [address], [place]  [phone]   [place], [address], [place] [phone]   [facility] [address] [place] [phone]   Lucianne Lei [address], [place]   [phone] Only accepts [place] Access [place] patients after they have their name applied to their card.   Self-Pay (no insurance) in [facility]:  Organization  Address  Phone   Notes  Sickle Cell Patients, [facility] [place] [phone]   [facility] [place] [phone]   [facility] [place]  [address], [address],  [phone]   [facility]/Dr. Osei-Bonsu  [address], [place] or [address], [place] [phone] Phone number for both [place] and [place] locations is the same.  [facility] [address], [place] [phone]   [facility] [address], [place] or [address] [phone]   [facility] [address] [place] [phone], phone; [phone], fax Sees patients 1st and 3rd Saturday of every month.  Must not qualify for public or private insurance (i.e. Medicaid, Medicare, [place] Health Choice, Veterans' Benefits)  Household income  should be no more than 200% of the poverty level The clinic cannot treat you if you are pregnant or think you are pregnant  Sexually transmitted diseases are not treated at the clinic.    Dental Care: Organization         Address  Phone  Notes  [facility] Department of [facility] [place] [phone] Accepts children up to age 52 who are enrolled in [place] or [place]; pregnant women with a Medicaid card; and children who have applied for Medicaid or [place] Health Choice, but were declined, whose parents can pay a reduced fee at time of service.  [facility] Department of [facility]  [address], [place] [phone] Accepts children up to age 68 who are enrolled in [place] or [place]; pregnant women with a Medicaid card; and children who have applied for Medicaid or [place] Health Choice, but were declined, whose parents can pay a reduced fee at time of service.  [place] Adult Dental Access PROGRAM  [place] [phone] Patients are seen by appointment only. Walk-ins are not accepted. [place] will see patients 73 years of age and older. Monday - Tuesday (8am-5pm) Most Wednesdays (8:30-5pm) $30 per visit, cash only  [facility] Adult Dental Access PROGRAM  [address], [facility] [phone] Patients are seen by appointment only. Walk-ins are not accepted. [place] will see patients 74 years of age and older. One Wednesday Evening (Monthly: Volunteer Based).  $30 per visit, cash only  [place]  [phone] for adults; Children under age 23, call Graduate Pediatric Dentistry at [phone]. Children aged 37-14, please call [phone] to request a pediatric application.  Dental services are provided in all areas of dental care including fillings, crowns and bridges, complete and partial dentures, implants, gum treatment,  root canals, and extractions. Preventive care is also provided. Treatment is provided to both adults and children. Patients are selected via a lottery and there is often a waiting list.   [facility] [address], [place]  [phone] [email or web address]   [facility] [address] [place], [place] [phone], Ext. [phone] Second and Fourth Thursday of each month, opens at 6:30 AM; Clinic ends at 9 AM.  Patients are seen on a first-come first-served basis, and a limited number are seen during each clinic.   [facility]  [address], [place]  [place], [place] [phone]   Eligibility Requirements You must have lived in [place], [place], or [place] counties for at least the last three months.   You cannot be eligible for state or federal sponsored [employer], including [employer], [place], or [employer].   You generally cannot be eligible for healthcare insurance through your employer.    How to apply: Eligibility screenings are held every Tuesday and Wednesday afternoon from 1:00 pm until 4:00 pm. You do not need an appointment for the interview!  [facility] [address], [place], [place]   [place]  Josephine Department  Gateway  [phone]    Behavioral Health Resources in the Community: Intensive Outpatient Programs Organization         Address  Phone  Notes  [place]. [address] [place], [place] [phone]   [facility] [address] [place], [place]   ADS: Alcohol & Drug Svcs [address], [place], [place]   [place] [address],  [place], [place] or [phone]   Substance Abuse Resources Organization         Address  Phone  Notes  Alcohol and Drug Services   [phone]   [place]  [phone]   The [place]   Chinita Pester  [phone]   Residential & Outpatient Substance Abuse Program  [phone]   Psychological Services Organization         Address  Phone  Notes  [facility] [place]  [place]  [phone]   [place] [address] [place] or [phone]    Mobile Crisis Teams Organization         Address  Phone  Notes  [facility], Mobile Crisis Care Unit  [phone]   [facility]  [address] [place], [place]   [place] [address], [place] [phone]    Self-Help/Support Groups Organization         Address  Phone             Notes  [place]. of [place] - variety of support groups  [place] Call for more information  Narcotics Anonymous (NA), [facility] [address], [employer] [place]  2 meetings at this location   Special educational needs teacher         Address  Phone  Notes  [facility]idential Treatment [place],    [place]  [phone]   [facility]  [address] [place] [address], [place], [place]   [place]rey, [place] [phone] Admissions: 8am-3pm M-F  Incentives Substance [place] [address],    [place], [place] [phone]   [facility] [address], [place]   [facility] [address]  [place], [place]   [facility]ight Programs - Intensive Outpatient [address], [place], [place]   [facility] [address].,  [place], [place] or [phone]   [facility] Services (RTS) [address] [place], [place] Accepts Medicaid  [facility] [address]  [place] [phone] Substance Abuse/Addiction Treatment   [facility] Resources Organization  Address  Phone  Notes  [facility]  [phone]   Domenic Schwab, PhD [address] Arlis Porta [place], [place]   [phone] or [phone]   [place], [place] [phone]   [place] [address], [place], [place] [phone] Insurance/Medicaid/sponsorship through [facility] and Families [address]                                    [place], [place] [phone] [place] [address], [place] [phone]    Dr. Adele Schilder  [phone]   [facility] [place] [facility] Dept. 1) [address] [place] 2) [place] 3)  [address], [place] [phone]  [phone]   [place] [phone] or [phone] (After Hours)

## 2014-03-23 NOTE — ED Provider Notes (Signed)
CSN: [id number]     Arrival date & time 03/23/14  2108 History   None    This chart was scribed for non-physician practitioner, Zacarias Pontes PA-C working with Hoy Morn, MD by Forrestine Him, ED Scribe. This patient was seen in room TR09C/TR09C and the patient's care was started at 10:28 PM.   Chief Complaint  Patient presents with  . Rash   Patient is a 23 y.o. male presenting with rash. The history is provided by the patient. No language interpreter was used.  Rash Location:  Face, head/neck and shoulder/arm Facial rash location:  Forehead and face Shoulder/arm rash location:  R arm and L arm Quality: itchiness   Severity:  Moderate Onset quality:  Gradual Duration:  2 days Timing:  Constant Progression:  Worsening Chronicity:  New Context: not animal contact, not chemical exposure, not diapers, not food, not insect bite/sting, not medications, not new detergent/soap, not sick contacts and not sun exposure   Relieved by:  Nothing Worsened by:  Nothing tried Ineffective treatments:  Anti-itch cream and antihistamines Associated symptoms: no abdominal pain, no fever, no joint pain, no nausea, no shortness of breath, no sore throat, not vomiting and not wheezing     HPI Comments: Edward Melendez is a 23 y.o. male with a PMHx of Asthma who presents to the Emergency Department complaining of a constant, moderate pruritic rash to the face and arms bilaterally x 2 days that has progressively worsening. 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 However, he is concerned this may be associated with something else. He has tried OTC Benadryl and topical anti-itching ointment without any noticeable improvement. He denies any new soaps, detergents, foods, or lotions. Denies new animal or plant exposure. At this time he denies any fever, chills, scaling, flaking, nausea, vomiting, SOB, tongue or lip  swelling, back pain, abdominal pain, sore throat, or chest pain. All immunizations UTD. Pt is otherwise healthy without any medical problems. No other concerns this visit. Nobody at home affected, no recent change in environment.  Past Medical History  Diagnosis Date  . Asthma    History reviewed. No pertinent past surgical history. History reviewed. No pertinent family history. History  Substance Use Topics  . Smoking status: Never Smoker   . Smokeless tobacco: Not on file  . Alcohol Use: No    Review of Systems  Constitutional: Negative for fever and chills.  HENT: Negative for drooling, mouth sores, sore throat and trouble swallowing.   Eyes: Negative for discharge, redness and itching.  Respiratory: Negative for cough, choking, chest tightness, shortness of breath and wheezing.   Cardiovascular: Negative for chest pain.  Gastrointestinal: Negative for nausea, vomiting and abdominal pain.  Musculoskeletal: Negative for arthralgias, neck pain and neck stiffness.  Skin: Positive for rash.  Neurological: Negative for dizziness, weakness and numbness.  Psychiatric/Behavioral: Negative for confusion.      Allergies  Review of patient's allergies indicates no known allergies.  Home Medications   Prior to Admission medications   Medication Sig Start Date End Date Taking? Authorizing Provider  hydrocortisone 2.5 % lotion Apply topically 2 (two) times daily. Apply to affected areas of your arms. 03/23/14   Farren Landa Strupp Camprubi-Soms, PA-C  hydrocortisone cream 1 % Apply to affected area of your face and neck 2 times daily 03/23/14   Laniyah Rosenwald Strupp Camprubi-Soms, PA-C  loratadine (CLARITIN) 10 MG tablet Take 1 tablet (10 mg  total) by mouth daily. 03/23/14   Demetress Tift Strupp Camprubi-Soms, PA-C   Triage Vitals: BP 130/85  Pulse 72  Temp(Src) 98.7 F (37.1 C) (Oral)  Resp 20  SpO2 99%   Physical Exam  Nursing note and vitals reviewed. Constitutional: He is oriented to person,  place, and time. Vital signs are normal. He appears well-developed and well-nourished. No distress.  Afebrile, nontoxic, in NAD  HENT:  Head: Normocephalic and atraumatic.  Nose: Nose normal.  Mouth/Throat: Uvula is midline, oropharynx is clear and moist and mucous membranes are normal. No uvula swelling.  Tongue and lips nonswollen, no erythema or edema, post oropharynx clear. Uvula midline and no swelling. MMM. [place]/AT, ears and nose WNL No stridor or airway compromise.  Eyes: Conjunctivae, EOM and lids are normal. Pupils are equal, round, and reactive to light.  Neck: Trachea normal, normal range of motion and phonation normal. Neck supple.  No stridor or difficulty with phonation  Cardiovascular: Normal rate and intact distal pulses.   Pulmonary/Chest: Effort normal and breath sounds normal. No stridor. No respiratory distress. He has no decreased breath sounds. He has no wheezes. He has no rhonchi. He has no rales.  CTAB in all lung fields, no resp distress or accessory muscle usage.  Abdominal: Normal appearance. He exhibits no distension.  Musculoskeletal: Normal range of motion.  Lymphadenopathy:       Head (right side): No submental, no submandibular and no tonsillar adenopathy present.       Head (left side): No submental, no submandibular and no tonsillar adenopathy present.    He has no cervical adenopathy.  No LAD  Neurological: He is alert and oriented to person, place, and time. No sensory deficit.  Sensation grossly intact over all exposed surfaces  Skin: Skin is warm and dry. Rash noted. No petechiae noted. Rash is maculopapular. Rash is not pustular, not vesicular and not urticarial. No erythema.  Maculopapular rash diffusely over the upper extremities, neck, and face. No sandpaper rash. No scaling, no interdigital webspace lesions or burrowing, no urticaria or tongue/lip swelling. No erythema or cellulitic areas. Flexor/extensor surfaces free of lesions.   Psychiatric: He  has a normal mood and affect.    ED Course  Procedures (including critical care time)  DIAGNOSTIC STUDIES: Oxygen Saturation is 99% on RA, Normal by my interpretation.    COORDINATION OF CARE: 10:38 PM- Will refer to dermatology. Will give hydrocortisone cream 1% and Claritin 10 mg to help manage symptoms. Discussed treatment plan with pt at bedside and pt agreed to plan.     Labs Review Labs Reviewed - No data to display  Imaging Review No results found.   EKG Interpretation None      MDM   Final diagnoses:  Rash of face    Edward Melendez is a 23 y.o. male with maculopapular rash over face and b/l upper extremities. No known allergen exposure. No fevers or URI symptoms. Pt ate strawberries recently, but not having any airway issues, although this has caused throat itching in the past. No palmar/plantar erythema or rash. Oropharynx clear. DDx includes viral exanthem, psoriasis, scabies, eczema, or allergic reaction. Does not appear to be fungal in nature. Pt afebrile and nontoxic. Will give hydrocortisone cream for itching, 1% on face and 2.5% on body. Will rx claritin daily to help with itching. Discussed washing with dove soap, avoiding allergens, and using lotion to help with dry skin. F/up with dermatologist for further eval and care. Doubt measles or mumps at  this time, given that pt is UTD. Doubt strep at this time given clear oropharynx. I explained the diagnosis and have given explicit precautions to return to the ER including for any other new or worsening symptoms. The patient understands and accepts the medical plan as it's been dictated and I have answered their questions. Discharge instructions concerning home care and prescriptions have been given. The patient is STABLE and is discharged to home in good condition.   I personally performed the services described in this documentation, which was scribed in my presence. The recorded information has been reviewed and is  accurate.   BP 120/88  Pulse 65  Temp(Src) 98.7 F (37.1 C) (Oral)  Resp 20  SpO2 96%   [employer], PA-C 03/24/14 [phone]

## 2014-03-23 NOTE — ED Notes (Signed)
Presents with rash to face, arms, began 2 days ago associated with itching.

## 2014-03-27 NOTE — ED Provider Notes (Signed)
Medical screening examination/treatment/procedure(s) were performed by non-physician practitioner and as supervising physician I was immediately available for consultation/collaboration.   EKG Interpretation None        Hoy Morn, MD 03/27/14 [phone]

## 2014-03-31 ENCOUNTER — Emergency Department (INDEPENDENT_AMBULATORY_CARE_PROVIDER_SITE_OTHER)
Admission: EM | Admit: 2014-03-31 | Discharge: 2014-03-31 | Disposition: A | Discharge status: Home / Self Care | Payer: Self-pay | Source: Home / Self Care | Attending: Family Medicine | Admitting: Family Medicine

## 2014-03-31 ENCOUNTER — Encounter (HOSPITAL_COMMUNITY): Payer: Self-pay | Admitting: Emergency Medicine

## 2014-03-31 DIAGNOSIS — L509 Urticaria, unspecified: Secondary | ICD-10-CM

## 2014-03-31 MED ORDER — FAMOTIDINE 20 MG PO TABS
20.0000 mg | ORAL_TABLET | Freq: Once | ORAL | Status: AC
  Administered 2014-03-31: 20 mg via ORAL

## 2014-03-31 MED ORDER — FAMOTIDINE 20 MG PO TABS
ORAL_TABLET | ORAL | Status: AC
  Filled 2014-03-31: qty 1

## 2014-03-31 MED ORDER — PREDNISONE 20 MG PO TABS
ORAL_TABLET | ORAL | Status: AC
  Filled 2014-03-31: qty 3

## 2014-03-31 MED ORDER — HYDROXYZINE HCL 25 MG PO TABS: 25.0000 mg | ORAL_TABLET | Freq: Three times a day (TID) | ORAL | Status: DC | PRN

## 2014-03-31 MED ORDER — PREDNISONE 20 MG PO TABS
60.0000 mg | ORAL_TABLET | Freq: Once | ORAL | Status: AC
  Administered 2014-03-31: 60 mg via ORAL

## 2014-03-31 MED ORDER — DIPHENHYDRAMINE HCL 25 MG PO CAPS
25.0000 mg | ORAL_CAPSULE | Freq: Once | ORAL | Status: AC
  Administered 2014-03-31: 25 mg via ORAL

## 2014-03-31 MED ORDER — FAMOTIDINE 20 MG PO TABS: 20.0000 mg | ORAL_TABLET | Freq: Two times a day (BID) | ORAL | Status: DC

## 2014-03-31 MED ORDER — DIPHENHYDRAMINE HCL 25 MG PO CAPS
ORAL_CAPSULE | ORAL | Status: AC
  Filled 2014-03-31: qty 1

## 2014-03-31 MED ORDER — METHYLPREDNISOLONE (PAK) 4 MG PO TABS: ORAL_TABLET | ORAL | Status: DC

## 2014-03-31 NOTE — ED Notes (Addendum)
Pt has  Rash  And  Itching  To  Both  Arms  As  Well  As   puffyness  And  Swelling of  The  Face  With    Symptoms  Onset  X  sev  Weeks   Pt  Seen  About  1  Week  Ago  For  Similar  Symptoms     But  Now  It is  Worse

## 2014-03-31 NOTE — Discharge Instructions (Signed)
May wish to consider wearing safety goggles and mask while working to limit contact with particulate airborne matter at work. Allergies Allergies may happen from anything your body is sensitive to. This may be food, medicines, pollens, chemicals, and nearly anything around you in everyday life that produces allergens. An allergen is anything that causes an allergy producing substance. Heredity is often a factor in causing these problems. This means you may have some of the same allergies as your parents. Food allergies happen in all age groups. Food allergies are some of the most severe and life threatening. Some common food allergies are cow's milk, seafood, eggs, nuts, wheat, and soybeans. SYMPTOMS   Swelling around the mouth.  An itchy red rash or hives.  Vomiting or diarrhea.  Difficulty breathing. SEVERE ALLERGIC REACTIONS ARE LIFE-THREATENING. This reaction is called anaphylaxis. It can cause the mouth and throat to swell and cause difficulty with breathing and swallowing. In severe reactions only a trace amount of food (for example, peanut oil in a salad) may cause death within seconds. Seasonal allergies occur in all age groups. These are seasonal because they usually occur during the same season every year. They may be a reaction to molds, grass pollens, or tree pollens. Other causes of problems are house dust mite allergens, pet dander, and mold spores. The symptoms often consist of nasal congestion, a runny itchy nose associated with sneezing, and tearing itchy eyes. There is often an associated itching of the mouth and ears. The problems happen when you come in contact with pollens and other allergens. Allergens are the particles in the air that the body reacts to with an allergic reaction. This causes you to release allergic antibodies. Through a chain of events, these eventually cause you to release histamine into the blood stream. Although it is meant to be protective to the body, it  is this release that causes your discomfort. This is why you were given anti-histamines to feel better. If you are unable to pinpoint the offending allergen, it may be determined by skin or blood testing. Allergies cannot be cured but can be controlled with medicine. Hay fever is a collection of all or some of the seasonal allergy problems. It may often be treated with simple over-the-counter medicine such as diphenhydramine. Take medicine as directed. Do not drink alcohol or drive while taking this medicine. Check with your caregiver or package insert for child dosages. If these medicines are not effective, there are many new medicines your caregiver can prescribe. Stronger medicine such as nasal spray, eye drops, and corticosteroids may be used if the first things you try do not work well. Other treatments such as immunotherapy or desensitizing injections can be used if all else fails. Follow up with your caregiver if problems continue. These seasonal allergies are usually not life threatening. They are generally more of a nuisance that can often be handled using medicine. HOME CARE INSTRUCTIONS   If unsure what causes a reaction, keep a diary of foods eaten and symptoms that follow. Avoid foods that cause reactions.  If hives or rash are present:  Take medicine as directed.  You may use an over-the-counter antihistamine (diphenhydramine) for hives and itching as needed.  Apply cold compresses (cloths) to the skin or take baths in cool water. Avoid hot baths or showers. Heat will make a rash and itching worse.  If you are severely allergic:  Following a treatment for a severe reaction, hospitalization is often required for closer follow-up.  Wear a  medic-alert bracelet or necklace stating the allergy.  You and your family must learn how to give adrenaline or use an anaphylaxis kit.  If you have had a severe reaction, always carry your anaphylaxis kit or EpiPen with you. Use this medicine  as directed by your caregiver if a severe reaction is occurring. Failure to do so could have a fatal outcome. SEEK MEDICAL CARE IF:  You suspect a food allergy. Symptoms generally happen within 30 minutes of eating a food.  Your symptoms have not gone away within 2 days or are getting worse.  You develop new symptoms.  You want to retest yourself or your child with a food or drink you think causes an allergic reaction. Never do this if an anaphylactic reaction to that food or drink has happened before. Only do this under the care of a caregiver. SEEK IMMEDIATE MEDICAL CARE IF:   You have difficulty breathing, are wheezing, or have a tight feeling in your chest or throat.  You have a swollen mouth, or you have hives, swelling, or itching all over your body.  You have had a severe reaction that has responded to your anaphylaxis kit or an EpiPen. These reactions may return when the medicine has worn off. These reactions should be considered life threatening. MAKE SURE YOU:   Understand these instructions.  Will watch your condition.  Will get help right away if you are not doing well or get worse. Document Released: 11/20/2002 Document Revised: 12/22/2012 Document Reviewed: 04/26/2008 [facility] Patient Information 2015 [place], [place]. This information is not intended to replace advice given to you by your health care provider. Make sure you discuss any questions you have with your health care provider.  Hives Hives are itchy, red, swollen areas of the skin. They can vary in size and location on your body. Hives can come and go for hours or several days (acute hives) or for several weeks (chronic hives). Hives do not spread from person to person (noncontagious). They may get worse with scratching, exercise, and emotional stress. CAUSES   Allergic reaction to food, additives, or drugs.  Infections, including the common cold.  Illness, such as vasculitis, lupus, or thyroid  disease.  Exposure to sunlight, heat, or cold.  Exercise.  Stress.  Contact with chemicals. SYMPTOMS   Red or white swollen patches on the skin. The patches may change size, shape, and location quickly and repeatedly.  Itching.  Swelling of the hands, feet, and face. This may occur if hives develop deeper in the skin. DIAGNOSIS  Your caregiver can usually tell what is wrong by performing a physical exam. Skin or blood tests may also be done to determine the cause of your hives. In some cases, the cause cannot be determined. TREATMENT  Mild cases usually get better with medicines such as antihistamines. Severe cases may require an emergency epinephrine injection. If the cause of your hives is known, treatment includes avoiding that trigger.  HOME CARE INSTRUCTIONS   Avoid causes that trigger your hives.  Take antihistamines as directed by your caregiver to reduce the severity of your hives. Non-sedating or low-sedating antihistamines are usually recommended. Do not drive while taking an antihistamine.  Take any other medicines prescribed for itching as directed by your caregiver.  Wear loose-fitting clothing.  Keep all follow-up appointments as directed by your caregiver. SEEK MEDICAL CARE IF:   You have persistent or severe itching that is not relieved with medicine.  You have painful or swollen joints. SEEK IMMEDIATE  MEDICAL CARE IF:   You have a fever.  Your tongue or lips are swollen.  You have trouble breathing or swallowing.  You feel tightness in the throat or chest.  You have abdominal pain. These problems may be the first sign of a life-threatening allergic reaction. Call your local emergency services (911 in [place] MAKE SURE YOU:   Understand these instructions.  Will watch your condition.  Will get help right away if you are not doing well or get worse. Document Released: 08/27/2005 Document Revised: 09/01/2013 Document Reviewed: 11/20/2011 [facility]  Patient Information 2015 [place], [place]. This information is not intended to replace advice given to you by your health care provider. Make sure you discuss any questions you have with your health care provider.

## 2014-03-31 NOTE — ED Provider Notes (Signed)
CSN: [id number]     Arrival date & time 03/31/14  1250 History   First MD Initiated Contact with Patient 03/31/14 1304     Chief Complaint  Patient presents with  . 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 States he has never developed swelling of lips, tongue or throat and has never had difficulty breathing, speaking or swallowing. Was seen for same in ER on 03/23/2014 and advised to begin using daily Claritin and topical steroid cream. Has been doing so and states symptoms continue to reoccur. PCP: none   Patient is a 23 y.o. male presenting with rash. The history is provided by the patient.  Rash Location:  Face and shoulder/arm Facial rash location:  L cheek, R cheek, L eye and R eye Shoulder/arm rash location:  L forearm and R forearm   Past Medical History  Diagnosis Date  . Asthma    History reviewed. No pertinent past surgical history. History reviewed. No pertinent family history. History  Substance Use Topics  . Smoking status: Never Smoker   . Smokeless tobacco: Not on file  . Alcohol Use: No    Review of Systems  Skin: Positive for rash.  All other systems reviewed and are negative.   Allergies  Review of patient's allergies indicates no known allergies.  Home Medications   Prior to Admission medications   Medication Sig Start Date End Date Taking? Authorizing Provider  famotidine (PEPCID) 20 MG tablet Take 1 tablet (20 mg total) by mouth 2 (two) times daily. 03/31/14   Lahoma Rocker, PA  hydrocortisone 2.5 % lotion Apply topically 2 (two) times daily. Apply to affected areas of your arms. 03/23/14   Mercedes Strupp Camprubi-Soms, PA-C  hydrocortisone cream 1 % Apply to affected area of your face and neck 2 times daily 03/23/14   Mercedes Strupp  Camprubi-Soms, PA-C  hydrOXYzine (ATARAX/VISTARIL) 25 MG tablet Take 1 tablet (25 mg total) by mouth 3 (three) times daily as needed for itching (and rash). 03/31/14   Lahoma Rocker, PA  methylPREDNIsolone (MEDROL DOSPACK) 4 MG tablet follow package directions 03/31/14   Annett Gula Andrzej Scully, PA   BP 131/81  Pulse 68  Temp(Src) 98.5 F (36.9 C) (Oral)  Resp 16  SpO2 97% Physical Exam  Nursing note and vitals reviewed. Constitutional: He is oriented to person, place, and time. He appears well-developed and well-nourished. No distress.  HENT:  Head: Normocephalic and atraumatic.  Right Ear: Hearing and external ear normal.  Left Ear: Hearing and external ear normal.  Nose: Nose normal.  Mouth/Throat: Uvula is midline, oropharynx is clear and moist and mucous membranes are normal. No uvula swelling.  No swelling of lips of tongue. Phonation normal  Moderate STS of bilateral periorbital tissues with small scattered hives on bilateral cheeks.   Eyes: Conjunctivae are normal. No scleral icterus.  Neck: Normal range of motion and phonation normal. Neck supple.  Cardiovascular: Normal rate, regular rhythm and normal heart sounds.   Pulmonary/Chest: Effort normal and breath sounds normal. No stridor.  Musculoskeletal: Normal range of motion.  Lymphadenopathy:    He has no cervical adenopathy.  Neurological: He is alert and oriented to person, place, and time.  Skin: Skin is warm and dry.  Few scattered hives at bilateral forearms  Psychiatric: He has a normal mood and affect. His behavior is normal.  ED Course  Procedures (including critical care time) Labs Review Labs Reviewed - No data to display  Imaging Review No results found.   MDM   1. Urticaria    Will initiate treatment in the [facility] with oral benadryl 25mg , prednisone 60mg  and pepcid 20mg  and treat at home with Atarax, Pepcid and Medrol dose pack. [facility]. No clinical  indication of anaphylaxis. May wish to consider wearing safety goggles and mask while working to limit contact with particulate airborne matter at work.    [place], [place] 03/31/14 [phone]

## 2014-04-02 NOTE — ED Provider Notes (Signed)
Medical screening examination/treatment/procedure(s) were performed by a resident physician or non-physician practitioner and as the supervising physician I was immediately available for consultation/collaboration.  Lynne Leader, MD    Gregor Hams, MD 04/02/14 [phone]

## 2014-05-24 ENCOUNTER — Emergency Department (HOSPITAL_COMMUNITY)
Admission: EM | Admit: 2014-05-24 | Discharge: 2014-05-24 | Disposition: A | Discharge status: Home / Self Care | Payer: Self-pay | Attending: Emergency Medicine | Admitting: Emergency Medicine

## 2014-05-24 ENCOUNTER — Encounter (HOSPITAL_COMMUNITY): Payer: Self-pay | Admitting: Emergency Medicine

## 2014-05-24 DIAGNOSIS — G43909 Migraine, unspecified, not intractable, without status migrainosus: Secondary | ICD-10-CM | POA: Insufficient documentation

## 2014-05-24 DIAGNOSIS — R51 Headache: Secondary | ICD-10-CM | POA: Insufficient documentation

## 2014-05-24 DIAGNOSIS — J45909 Unspecified asthma, uncomplicated: Secondary | ICD-10-CM | POA: Insufficient documentation

## 2014-05-24 MED ORDER — ONDANSETRON 4 MG PO TBDP
8.0000 mg | ORAL_TABLET | Freq: Once | ORAL | Status: AC
  Administered 2014-05-24: 8 mg via ORAL
  Filled 2014-05-24: qty 2

## 2014-05-24 MED ORDER — DIPHENHYDRAMINE HCL 50 MG/ML IJ SOLN
25.0000 mg | Freq: Once | INTRAMUSCULAR | Status: AC
  Administered 2014-05-24: 25 mg via INTRAVENOUS
  Filled 2014-05-24: qty 1

## 2014-05-24 MED ORDER — IBUPROFEN 800 MG PO TABS: 800.0000 mg | ORAL_TABLET | Freq: Three times a day (TID) | ORAL | Status: AC | PRN

## 2014-05-24 MED ORDER — SODIUM CHLORIDE 0.9 % IV BOLUS (SEPSIS)
1000.0000 mL | Freq: Once | INTRAVENOUS | Status: AC
  Administered 2014-05-24: 1000 mL via INTRAVENOUS

## 2014-05-24 MED ORDER — KETOROLAC TROMETHAMINE 30 MG/ML IJ SOLN
30.0000 mg | Freq: Once | INTRAMUSCULAR | Status: AC
  Administered 2014-05-24: 30 mg via INTRAVENOUS
  Filled 2014-05-24: qty 1

## 2014-05-24 MED ORDER — PROCHLORPERAZINE EDISYLATE 5 MG/ML IJ SOLN
10.0000 mg | Freq: Once | INTRAMUSCULAR | Status: AC
  Administered 2014-05-24: 10 mg via INTRAVENOUS
  Filled 2014-05-24: qty 2

## 2014-05-24 NOTE — ED Notes (Signed)
Pt given zofran  Due to throwing up , pt thinks its from taking pain meds on an empty stomach

## 2014-05-24 NOTE — ED Provider Notes (Signed)
CSN: [id number]     Arrival date & time 05/24/14  0515 History   First MD Initiated Contact with Patient 05/24/14 [phone]     Chief Complaint  Patient presents with  . Headache     (Consider location/radiation/quality/duration/timing/severity/associated sxs/prior Treatment) HPI Patient presents to the emergency department with headache has been ongoing since yesterday.  The patient, states the headache started around 7 PM.  Patient, states she's had previous episodes with these types of headaches.  Patient, states, that he has had some mild nausea, with one episode of vomiting.  Patient, states, that he's had some mild photophobia, as well.  The patient denies chest pain, shortness of breath, weakness, dizziness, blurred vision, neck pain, fever, or syncope.  The patient, states, that he didn't take any medications prior to arrival and seems make his condition, better. Past Medical History  Diagnosis Date  . Asthma    History reviewed. No pertinent past surgical history. No family history on file. History  Substance Use Topics  . Smoking status: Never Smoker   . Smokeless tobacco: Not on file  . Alcohol Use: No    Review of Systems  All other systems negative except as documented in the HPI. All pertinent positives and negatives as reviewed in the HPI.   Allergies  Review of patient's allergies indicates no known allergies.  Home Medications   Prior to Admission medications   Not on File   BP 153/78  Pulse 100  Temp(Src) 99.6 F (37.6 C) (Oral)  Resp 18  Ht 5\' 10"  (1.778 m)  Wt 230 lb (104.327 kg)  BMI 33.00 kg/m2  SpO2 99% Physical Exam  Constitutional: He is oriented to person, place, and time. He appears well-developed and well-nourished. No distress.  HENT:  Head: Normocephalic and atraumatic.  Mouth/Throat: Oropharynx is clear and moist.  Eyes: EOM are normal. Pupils are equal, round, and reactive to light.  Neck: Normal range of motion. Neck supple.   Cardiovascular: Normal rate, regular rhythm and normal heart sounds.  Exam reveals no gallop and no friction rub.   No murmur heard. Pulmonary/Chest: Effort normal and breath sounds normal. No respiratory distress.  Lymphadenopathy:    He has no cervical adenopathy.  Neurological: He is alert and oriented to person, place, and time. He has normal strength. No sensory deficit. He exhibits normal muscle tone. Coordination and gait normal. GCS eye subscore is 4. GCS verbal subscore is 5. GCS motor subscore is 6.  Skin: Skin is warm and dry.    ED Course  Procedures (including critical care time) Patient has had a previous diagnosis of migraines, and will be referred to neurology.  Told to return here as needed.  Advised him to increase his fluid intake Patient does not have any significant findings ON physical exam.     Brent General, PA-C 05/24/14 [phone]

## 2014-05-24 NOTE — ED Notes (Signed)
Pt resting in bed sleeping, NAD at present.

## 2014-05-24 NOTE — ED Notes (Signed)
The pt is c/o a headache since yesterday with nausea.  Hx of seizures.  His headache started at 1900

## 2014-05-24 NOTE — ED Provider Notes (Signed)
Medical screening examination/treatment/procedure(s) were conducted as a shared visit with non-physician practitioner(s) or resident and myself. I personally evaluated the patient during the encounter and agree with the findings.  I have personally reviewed any xrays and/ or EKG's with the provider and I agree with interpretation.  23 year old male with presents with recurrent headache. Patient has had gradual onset headache every one to 2 weeks with mild photophobia and nausea. This headache is gradually worsened since yesterday evening. No head injury. No fevers or neck stiffness or tick bites. Patient feels these are similar to multiple previous. I did discuss with him with recurrent headache that outpatient neurology may be of help decrease frequency.5+ strength in UE and LE with f/e at major joints.  Sensation to palpation intact in UE and LE.  CNs 2-12 grossly intact. EOMFI. PERRL.  Finger nose and coordination intact bilateral.  Visual fields intact to finger testing. No meningismus.  Plan for migraine/headache cocktail, reassessment and outpatient followup.  Headache   Mariea Clonts, MD 05/24/14 [phone]

## 2014-05-24 NOTE — ED Notes (Signed)
H/a for a couple of years has never been bad , has seen a dr and was told he had migraines but this one started 7 pm yesterday and has not gotten better . Took a friends med and that made him nauseous he states and he vomited x 1

## 2014-05-24 NOTE — Discharge Instructions (Signed)
Return here as needed.  Follow-up with the neurologist provided °

## 2014-05-31 ENCOUNTER — Encounter (HOSPITAL_COMMUNITY): Payer: Self-pay | Admitting: Emergency Medicine

## 2014-05-31 ENCOUNTER — Emergency Department (HOSPITAL_COMMUNITY)
Admission: EM | Admit: 2014-05-31 | Discharge: 2014-05-31 | Disposition: A | Discharge status: Home / Self Care | Payer: Self-pay | Attending: Emergency Medicine | Admitting: Emergency Medicine

## 2014-05-31 DIAGNOSIS — J45909 Unspecified asthma, uncomplicated: Secondary | ICD-10-CM | POA: Insufficient documentation

## 2014-05-31 DIAGNOSIS — H109 Unspecified conjunctivitis: Secondary | ICD-10-CM | POA: Insufficient documentation

## 2014-05-31 DIAGNOSIS — Z79899 Other long term (current) drug therapy: Secondary | ICD-10-CM | POA: Insufficient documentation

## 2014-05-31 DIAGNOSIS — H5710 Ocular pain, unspecified eye: Secondary | ICD-10-CM | POA: Insufficient documentation

## 2014-05-31 MED ORDER — FLUORESCEIN SODIUM 1 MG OP STRP
1.0000 | ORAL_STRIP | Freq: Once | OPHTHALMIC | Status: DC
  Filled 2014-05-31: qty 1

## 2014-05-31 MED ORDER — TOBRAMYCIN 0.3 % OP SOLN: 1.0000 [drp] | OPHTHALMIC | Status: AC

## 2014-05-31 MED ORDER — TETRACAINE HCL 0.5 % OP SOLN
1.0000 [drp] | Freq: Once | OPHTHALMIC | Status: DC
  Filled 2014-05-31: qty 2

## 2014-05-31 NOTE — Progress Notes (Signed)
[place],   Did not get to see patient but will be sending information about [place] program to help patient establish primary care, using the address provided.

## 2014-05-31 NOTE — ED Notes (Signed)
Pt sts that three days ago pt has had irritation to his R eye. Pt rubbed his eye and it has not improved yet. Pt sts he has blurred vision and reddness to eye. Pt does normally wear glasses.

## 2014-05-31 NOTE — ED Provider Notes (Signed)
CSN: [id number]     Arrival date & time 05/31/14  0920 History   First MD Initiated Contact with Patient 05/31/14 1001     Chief Complaint  Patient presents with  . Eye Pain   (Consider location/radiation/quality/duration/timing/severity/associated sxs/prior Treatment) HPI Edward Melendez is 23 yo male presenting with rt eye pain x 3 days.  Pt states he woke up and eye was stuck together.  He had a sensation something was in his eye, so he rubbed it and the feeling improved.  Since then, it has been red, draining clear fluid but sometimes yellowish drainage also.  He reports it itches and also hurts.  He states he has chronic headaches but no change in his headache since the eye symptoms.  He denies blurred vision, nausea, vomiting or sudden severe headache.  Past Medical History  Diagnosis Date  . Asthma    History reviewed. No pertinent past surgical history. History reviewed. No pertinent family history. History  Substance Use Topics  . Smoking status: Never Smoker   . Smokeless tobacco: Not on file  . Alcohol Use: No    Review of Systems  Constitutional: Negative for fever.  HENT: Negative for congestion and sore throat.   Eyes: Positive for pain, discharge, redness and itching. Negative for visual disturbance.  Gastrointestinal: Negative for nausea and vomiting.  Genitourinary: Negative for dysuria.  Skin: Negative for rash.  Neurological: Negative for weakness, numbness and headaches.    Allergies  Review of patient's allergies indicates no known allergies.  Home Medications   Prior to Admission medications   Medication Sig Start Date End Date Taking? Authorizing Provider  ibuprofen (ADVIL,MOTRIN) 800 MG tablet Take 1 tablet (800 mg total) by mouth every 8 (eight) hours as needed. 05/24/14  Yes Resa Miner Lawyer, PA-C  Polyvinyl Alcohol-Povidone (CLEAR EYES ALL SEASONS) 5-6 MG/ML SOLN Apply 2 drops to eye 2 (two) times daily as needed (for allergies).   Yes  Historical Provider, MD   BP 135/92  Pulse 79  Temp(Src) 98.4 F (36.9 C) (Oral)  Resp 18  SpO2 100% Physical Exam  Nursing note and vitals reviewed. Constitutional: He appears well-developed and well-nourished. No distress.  HENT:  Head: Normocephalic and atraumatic.  Eyes: EOM are normal. Pupils are equal, round, and reactive to light. Right eye exhibits discharge. Left eye exhibits no discharge. Right conjunctiva is injected. Right conjunctiva has no hemorrhage. No scleral icterus.  Cardiovascular: Intact distal pulses.   Pulmonary/Chest: Effort normal.  Neurological: He is alert. Coordination normal.  Skin: He is not diaphoretic.    ED Course  Procedures (including critical care time) Labs Review Labs Reviewed - No data to display  Imaging Review No results found.   EKG Interpretation None      MDM   Final diagnoses:  Conjunctivitis of right eye   Bacterial conjunctivitis based on presentation & eye exam.  Pt does not wear contact lens. Exam non-concerning for orbital cellulitis, hyphema, corneal ulcers, corneal abrasions or trauma.    Patient has been instructed to use cool compresses and practice personal hygiene with frequent hand washing.  Discharge instructions include prescription for abx eye drops and instructions to follow up with primary care provider.  Pt verbalizes understanding and in agreement with plan.  Filed Vitals:   05/31/14 0928 05/31/14 1106  BP: 135/92 130/84  Pulse: 79 75  Temp: 98.4 F (36.9 C)   TempSrc: Oral   Resp: 18   SpO2: 100% 99%    Meds given  in ED:  Medications - No data to display  Discharge Medication List as of 05/31/2014 10:45 AM    START taking these medications   Details  tobramycin (TOBREX) 0.3 % ophthalmic solution Place 1 drop into the right eye every 4 (four) hours. Place one drop in the affected eye every 4 hours for 7 days, Starting 05/31/2014, Until Discontinued, Print           Britt Bottom,  NP 06/01/14 1209

## 2014-05-31 NOTE — Discharge Instructions (Signed)
Please follow the directions provided.  Be sure to follow up with your primary care provider if symptoms worsen or do not improve.  SEEK IMMEDIATE MEDICAL CARE IF:  Your infection has not improved within 3 days after beginning treatment.  You had yellow discharge from your eye and it returns.  You have increased eye pain.  Your eye redness is spreading.  Your vision becomes blurred.  You have a fever or persistent symptoms for more than 2-3 days.  You have a fever and your symptoms suddenly get worse.  You have facial pain, redness, or swelling.

## 2014-05-31 NOTE — ED Provider Notes (Signed)
Patient awakened 3 days ago with a sensation of foreign body in right eye. He no longer has foreign body sensation. Denies any eye pain. He is awake and each morning for the past 3 days with white discharge on his eyelashes. He denies visual changes. On exam patient is alert no distress right hip with some conjunctival erythema pupils equal round react to light. Examine history consistent with conjunctivitis  Edward Dakin, MD 05/31/14 1040

## 2014-06-01 NOTE — ED Provider Notes (Signed)
Medical screening examination/treatment/procedure(s) were conducted as a shared visit with non-physician practitioner(s) and myself.  I personally evaluated the patient during the encounter.   EKG Interpretation None       Orlie Dakin, MD 06/01/14 1622

## 2014-11-25 ENCOUNTER — Encounter (HOSPITAL_COMMUNITY): Payer: Self-pay | Admitting: Emergency Medicine

## 2014-11-25 ENCOUNTER — Emergency Department (HOSPITAL_COMMUNITY)
Admission: EM | Admit: 2014-11-25 | Discharge: 2014-11-26 | Disposition: A | Discharge status: Home / Self Care | Payer: Self-pay | Attending: Emergency Medicine | Admitting: Emergency Medicine

## 2014-11-25 DIAGNOSIS — J45901 Unspecified asthma with (acute) exacerbation: Secondary | ICD-10-CM | POA: Insufficient documentation

## 2014-11-25 DIAGNOSIS — R079 Chest pain, unspecified: Secondary | ICD-10-CM | POA: Insufficient documentation

## 2014-11-25 LAB — BASIC METABOLIC PANEL
ANION GAP: 7 (ref 5–15)
BUN: 14 mg/dL (ref 6–23)
CO2: 27 mmol/L (ref 19–32)
Calcium: 9.3 mg/dL (ref 8.4–10.5)
Chloride: 105 mmol/L (ref 96–112)
Creatinine, Ser: 1 mg/dL (ref 0.50–1.35)
GLUCOSE: 119 mg/dL — AB (ref 70–99)
POTASSIUM: 3.4 mmol/L — AB (ref 3.5–5.1)
Sodium: 139 mmol/L (ref 135–145)

## 2014-11-25 LAB — CBC
HCT: 37.9 % — ABNORMAL LOW (ref 39.0–52.0)
Hemoglobin: 12.6 g/dL — ABNORMAL LOW (ref 13.0–17.0)
MCH: 27.6 pg (ref 26.0–34.0)
MCHC: 33.2 g/dL (ref 30.0–36.0)
MCV: 82.9 fL (ref 78.0–100.0)
Platelets: 319 10*3/uL (ref 150–400)
RBC: 4.57 MIL/uL (ref 4.22–5.81)
RDW: 13.8 % (ref 11.5–15.5)
WBC: 9 10*3/uL (ref 4.0–10.5)

## 2014-11-25 NOTE — ED Notes (Signed)
Patient here with complaint of chest palpitations. States that he was laying down, began to feel sensation, started feeling weak and lightheaded, and had sudden need to cough. Sensations lasted about 2 mins. Reports occurrence x3 throughout day today. Reports no prior episodes before today. Reports no other illness in recent days.

## 2014-11-26 ENCOUNTER — Emergency Department (HOSPITAL_COMMUNITY): Payer: Self-pay

## 2014-11-26 LAB — I-STAT TROPONIN, ED: TROPONIN I, POC: 0 ng/mL (ref 0.00–0.08)

## 2014-11-26 NOTE — Discharge Instructions (Signed)
Chest Pain (Nonspecific) °It is often hard to give a diagnosis for the cause of chest pain. There is always a chance that your pain could be related to something serious, such as a heart attack or a blood clot in the lungs. You need to follow up with your doctor. °HOME CARE °· If antibiotic medicine was given, take it as directed by your doctor. Finish the medicine even if you start to feel better. °· For the next few days, avoid activities that bring on chest pain. Continue physical activities as told by your doctor. °· Do not use any tobacco products. This includes cigarettes, chewing tobacco, and e-cigarettes. °· Avoid drinking alcohol. °· Only take medicine as told by your doctor. °· Follow your doctor's suggestions for more testing if your chest pain does not go away. °· Keep all doctor visits you made. °GET HELP IF: °· Your chest pain does not go away, even after treatment. °· You have a rash with blisters on your chest. °· You have a fever. °GET HELP RIGHT AWAY IF:  °· You have more pain or pain that spreads to your arm, neck, jaw, back, or belly (abdomen). °· You have shortness of breath. °· You cough more than usual or cough up blood. °· You have very bad back or belly pain. °· You feel sick to your stomach (nauseous) or throw up (vomit). °· You have very bad weakness. °· You pass out (faint). °· You have chills. °This is an emergency. Do not wait to see if the problems will go away. Call your local emergency services (911 in [place] Do not drive yourself to the hospital. °MAKE SURE YOU:  °· Understand these instructions. °· Will watch your condition. °· Will get help right away if you are not doing well or get worse. °Document Released: 02/13/2008 Document Revised: 09/01/2013 Document Reviewed: 02/13/2008 °ExitCare® Patient Information ©2015 [employer]. This information is not intended to replace advice given to you by your health care provider. Make sure you discuss any questions you have with your  health care provider. ° ° °Emergency Department Resource Guide °1) Find a Doctor and Pay Out of Pocket °Although you won't have to find out who is covered by your insurance plan, it is a good idea to ask around and get recommendations. You will then need to call the office and see if the doctor you have chosen will accept you as a new patient and what types of options they offer for patients who are self-pay. Some doctors offer discounts or will set up payment plans for their patients who do not have insurance, but you will need to ask so you aren't surprised when you get to your appointment. ° °2) Contact Your Local Health Department °Not all health departments have doctors that can see patients for sick visits, but many do, so it is worth a call to see if yours does. If you don't know where your local health department is, you can check in your phone book. The CDC also has a tool to help you locate your state's health department, and many state websites also have listings of all of their local health departments. ° °3) Find a Walk-in Clinic °If your illness is not likely to be very severe or complicated, you may want to try a walk in clinic. These are popping up all over the country in pharmacies, drugstores, and shopping centers. They're usually staffed by nurse practitioners or physician assistants that have been trained to treat common   illnesses and complaints. They're usually fairly quick and inexpensive. However, if you have serious medical issues or chronic medical problems, these are probably not your best option. ° °No Primary Care Doctor: °- Call [facility] at  832-8000 - they can help you locate a primary care doctor that  accepts your insurance, provides certain services, etc. °- Physician Referral Service- 1-800-533-3463 ° °Chronic Pain Problems: °Organization         Address  Phone   Notes  °[facility]  ([phone] Patients need to be referred by their primary care doctor.   ° °Medication Assistance: °Organization         Address  Phone   Notes  °[place] [facility] [address] °[place], [place] [address] [phone]) [phone] --Must be a resident of [place] °-- Must have NO insurance coverage whatsoever (no Medicaid/ Medicare, etc.) °-- The pt. MUST have a primary care doctor that directs their care regularly and follows them in the community °  °[facility]  (866) 331-1348   °[employer]  (888) 892-1162   ° °Agencies that provide inexpensive medical care: °Organization         Address  Phone   Notes  °[facility]  ([phone]) [phone]   °[facility]    ([phone]) [phone]   °[facility] [address] °[place], [place] [address] [phone]) [phone]   °[facility] [address], °[place] ([phone]) [phone]   °[facility]    ([phone]) 373-0678   °[facility]    ([phone]) [phone]   °[facility] ° [address], [place] Phone:  ([phone]) [phone], Fax:  ([phone] Hours of Operation:  9 am - 6 pm, M-F.  Also accepts Medicaid/Medicare and self-pay.  °[facility] ° [address], [place] Phone: ([phone]) [phone], Fax: [phone]. Hours of Operation:  8:30 am - 5:30 pm, M-F.  Also accepts Medicaid and self-pay.  °[facility] [place] [address], [place] Phone: ([phone]) [phone]   °[facility] [address], [place], [place] (336)[phone], Ext. 123 Mondays & Thursdays: 7-9 AM.  First 15 patients are seen on a first come, first serve basis. °  ° °Medicaid-accepting [place] Providers: ° °Organization         Address  Phone   Notes  °[facility] [address], [place] ([phone]) [phone] Also accepts self-pay patients.  °[facility] [address], [place] ° ([phone]) [phone]   °[facility] [address], [place] ([phone]) [phone]   °[facility] [address] [place] [address], [place] ([phone]) [phone]   °[facility] [address], [place]  ° ([phone]) [phone] Only accepts [place] Access Medicaid patients after they have their name applied to their card.  ° °Self-Pay (no insurance) in [place]: ° °Organization         Address  Phone   Notes  °Sickle Cell Patients, [facility] [address], [place] ([phone]) [phone]   °[facility] [address], [place] ([phone]) [phone]   °[facility] ° [address], [place] ([phone]) [phone]   °[facility]/Dr. Osei-Bonsu ° [address] [place] [address],  or [address],   [place] [phone] Phone number for both [facility] and [place] locations is the same.  [facility] [address] [place] [phone]   [facility] [address], [place] or [address] [phone]   [facility] [address] [place] [phone], phone; [phone], fax Sees patients 1st and 3rd Saturday of every month.  Must not qualify for public or private insurance (i.e. Medicaid, Medicare, [place] Health Choice, Veterans' Benefits)  Household income should be no more than 200% of the poverty level The clinic cannot treat you if you are pregnant or think you are pregnant  Sexually transmitted diseases are not treated at the clinic.

## 2014-11-26 NOTE — ED Provider Notes (Signed)
CSN: [id number]     Arrival date & time 11/25/14  2241 History   First MD Initiated Contact with Patient 11/25/14 2359     Chief Complaint  Patient presents with  . Palpitations     (Consider location/radiation/quality/duration/timing/severity/associated sxs/prior Treatment) Patient is a 24 y.o. male presenting with palpitations. The history is provided by the patient. No language interpreter was used.  Palpitations Duration:  3 minutes Timing:  Intermittent Associated symptoms: shortness of breath   Associated symptoms: no nausea, no vomiting and no weakness   Associated symptoms comment:  He is describing chest discomfort associated with SOB and a sense of heart racing that lasts for a few minutes then goes away. Symptoms started earlier today and he has had 3 episodes throughout the day. He denies history of anxiety or panic. No recent fevers or cough. He denies drug use.    Past Medical History  Diagnosis Date  . Asthma    History reviewed. No pertinent past surgical history. History reviewed. No pertinent family history. History  Substance Use Topics  . Smoking status: Never Smoker   . Smokeless tobacco: Not on file  . Alcohol Use: No    Review of Systems  Constitutional: Negative.   HENT: Negative.   Respiratory: Positive for shortness of breath.   Cardiovascular: Positive for palpitations. Negative for leg swelling.  Gastrointestinal: Negative.  Negative for nausea and vomiting.  Musculoskeletal: Negative.  Negative for myalgias.  Neurological: Negative.  Negative for weakness and headaches.      Allergies  Review of patient's allergies indicates no known allergies.  Home Medications   Prior to Admission medications   Medication Sig Start Date End Date Taking? Authorizing Provider  ibuprofen (ADVIL,MOTRIN) 800 MG tablet Take 1 tablet (800 mg total) by mouth every 8 (eight) hours as needed. 05/24/14   Dalia Heading, PA-C  Polyvinyl Alcohol-Povidone (CLEAR  EYES ALL SEASONS) 5-6 MG/ML SOLN Apply 2 drops to eye 2 (two) times daily as needed (for allergies).    Historical Provider, MD  tobramycin (TOBREX) 0.3 % ophthalmic solution Place 1 drop into the right eye every 4 (four) hours. Place one drop in the affected eye every 4 hours for 7 days 05/31/14   Britt Bottom, NP   BP 151/86 mmHg  Pulse 76  Temp(Src) 98.3 F (36.8 C) (Oral)  Resp 18  Ht 5\' 10"  (1.778 m)  Wt 240 lb (108.863 kg)  BMI 34.44 kg/m2  SpO2 98% Physical Exam  Constitutional: He is oriented to person, place, and time. He appears well-developed and well-nourished.  HENT:  Head: Normocephalic.  Neck: Normal range of motion. Neck supple.  Cardiovascular: Normal rate and regular rhythm.   Pulmonary/Chest: Effort normal and breath sounds normal. He exhibits no tenderness.  Abdominal: Soft. Bowel sounds are normal. There is no tenderness. There is no rebound and no guarding.  Musculoskeletal: Normal range of motion. He exhibits no edema.  Neurological: He is alert and oriented to person, place, and time.  Skin: Skin is warm and dry. No rash noted.  Psychiatric: He has a normal mood and affect.    ED Course  Procedures (including critical care time) Labs Review Labs Reviewed  CBC - Abnormal; Notable for the following:    Hemoglobin 12.6 (*)    HCT 37.9 (*)    All other components within normal limits  BASIC METABOLIC PANEL - Abnormal; Notable for the following:    Potassium 3.4 (*)    Glucose, Bld 119 (*)  All other components within normal limits  I-STAT TROPOININ, ED   Results for orders placed or performed during the hospital encounter of 11/25/14  CBC  Result Value Ref Range   WBC 9.0 4.0 - 10.5 K/uL   RBC 4.57 4.22 - 5.81 MIL/uL   Hemoglobin 12.6 (L) 13.0 - 17.0 g/dL   HCT 37.9 (L) 39.0 - 52.0 %   MCV 82.9 78.0 - 100.0 fL   MCH 27.6 26.0 - 34.0 pg   MCHC 33.2 30.0 - 36.0 g/dL   RDW 13.8 11.5 - 15.5 %   Platelets 319 150 - 400 K/uL  Basic metabolic  panel  Result Value Ref Range   Sodium 139 135 - 145 mmol/L   Potassium 3.4 (L) 3.5 - 5.1 mmol/L   Chloride 105 96 - 112 mmol/L   CO2 27 19 - 32 mmol/L   Glucose, Bld 119 (H) 70 - 99 mg/dL   BUN 14 6 - 23 mg/dL   Creatinine, Ser 1.00 0.50 - 1.35 mg/dL   Calcium 9.3 8.4 - 10.5 mg/dL   GFR calc non Af Amer >90 >90 mL/min   GFR calc Af Amer >90 >90 mL/min   Anion gap 7 5 - 15  I-stat troponin, ED (not at [facility])  Result Value Ref Range   Troponin i, poc 0.00 0.00 - 0.08 ng/mL   Comment 3           Dg Chest 2 View  11/26/2014   CLINICAL DATA:  Bold centralized chest pain and weakness beginning this afternoon.  EXAM: CHEST  2 VIEW  COMPARISON:  None.  FINDINGS: Cardiomediastinal silhouette is unremarkable. The lungs are clear without pleural effusions or focal consolidations. Trachea projects midline and there is no pneumothorax. Soft tissue planes and included osseous structures are non-suspicious.  IMPRESSION: Normal chest.   Electronically Signed   By: Elon Alas   On: 11/26/2014 02:03    Imaging Review No results found.   EKG Interpretation None      MDM   Final diagnoses:  None    1. Chest pain 2. Anxiety  Pattern of symptoms describe anxiety. The patient denies stressful situation or history of anxiety. All lab studies, EKG, imaging are negative and reassuring. Low risk for cardiac origin (heart score 0). Feel he is appropriate for discharge home and PCP follow up.    Charlann Lange, PA-C 11/26/14 [place], DO 11/26/14 [phone]

## 2014-11-26 NOTE — ED Notes (Signed)
Called main lab in regards to i-stat troponin.

## 2014-11-26 NOTE — ED Notes (Signed)
Shari, PA-C, at the bedside.  

## 2015-08-16 ENCOUNTER — Encounter (HOSPITAL_COMMUNITY): Payer: Self-pay | Admitting: Family Medicine

## 2015-08-16 ENCOUNTER — Emergency Department (HOSPITAL_COMMUNITY)
Admission: EM | Admit: 2015-08-16 | Discharge: 2015-08-17 | Disposition: A | Discharge status: Home / Self Care | Payer: Self-pay | Attending: Emergency Medicine | Admitting: Emergency Medicine

## 2015-08-16 DIAGNOSIS — R51 Headache: Secondary | ICD-10-CM | POA: Insufficient documentation

## 2015-08-16 DIAGNOSIS — R0789 Other chest pain: Secondary | ICD-10-CM | POA: Insufficient documentation

## 2015-08-16 DIAGNOSIS — J45909 Unspecified asthma, uncomplicated: Secondary | ICD-10-CM | POA: Insufficient documentation

## 2015-08-16 DIAGNOSIS — F419 Anxiety disorder, unspecified: Secondary | ICD-10-CM | POA: Insufficient documentation

## 2015-08-16 NOTE — ED Notes (Signed)
Pt is complaining of mid-sternal and right sided chest pain that about 2 months ago. Pt states he had a panic attack about 2 months ago and since he has been experiencing chest pain. Pt reports chest pain is intermittent and at times constant. Associated complaints is shortness of breath. Pt has not followed up with a PCP due to not having a PCP. Pt denies taking any medications for the pain.

## 2015-08-17 ENCOUNTER — Emergency Department (HOSPITAL_COMMUNITY): Payer: Self-pay

## 2015-08-17 LAB — CBC
HCT: 38.3 % — ABNORMAL LOW (ref 39.0–52.0)
Hemoglobin: 12.6 g/dL — ABNORMAL LOW (ref 13.0–17.0)
MCH: 26.8 pg (ref 26.0–34.0)
MCHC: 32.9 g/dL (ref 30.0–36.0)
MCV: 81.5 fL (ref 78.0–100.0)
PLATELETS: 334 10*3/uL (ref 150–400)
RBC: 4.7 MIL/uL (ref 4.22–5.81)
RDW: 13.8 % (ref 11.5–15.5)
WBC: 10.1 10*3/uL (ref 4.0–10.5)

## 2015-08-17 LAB — BASIC METABOLIC PANEL
ANION GAP: 6 (ref 5–15)
BUN: 12 mg/dL (ref 6–20)
CALCIUM: 9.8 mg/dL (ref 8.9–10.3)
CO2: 31 mmol/L (ref 22–32)
CREATININE: 0.86 mg/dL (ref 0.61–1.24)
Chloride: 105 mmol/L (ref 101–111)
GFR calc Af Amer: >60 mL/min (ref >60)
GLUCOSE: 103 mg/dL — AB (ref 65–99)
Potassium: 3.7 mmol/L (ref 3.5–5.1)
Sodium: 142 mmol/L (ref 135–145)

## 2015-08-17 LAB — I-STAT TROPONIN, ED: TROPONIN I, POC: 0 ng/mL (ref 0.00–0.08)

## 2015-08-17 MED ORDER — HYDROXYZINE HCL 25 MG PO TABS
25.0000 mg | ORAL_TABLET | Freq: Once | ORAL | Status: AC
  Administered 2015-08-17: 25 mg via ORAL
  Filled 2015-08-17: qty 1

## 2015-08-17 MED ORDER — HYDROXYZINE HCL 25 MG PO TABS: 25.0000 mg | ORAL_TABLET | Freq: Four times a day (QID) | ORAL | Status: AC | PRN

## 2015-08-17 NOTE — Discharge Instructions (Signed)
As discusses reassuring that her EKG, chest x-ray and troponin are all within normal parameters.  You will will be given a resource list to help you find a primary care physician   Emergency Department Resource Guide 1) Find a Doctor and Pay Out of Pocket Although you won't have to find out who is covered by your insurance plan, it is a good idea to ask around and get recommendations. You will then need to call the office and see if the doctor you have chosen will accept you as a new patient and what types of options they offer for patients who are self-pay. Some doctors offer discounts or will set up payment plans for their patients who do not have insurance, but you will need to ask so you aren't surprised when you get to your appointment.  2) Contact Your Local Health Department Not all health departments have doctors that can see patients for sick visits, but many do, so it is worth a call to see if yours does. If you don't know where your local health department is, you can check in your phone book. The CDC also has a tool to help you locate your state's health department, and many state websites also have listings of all of their local health departments.  3) Find a [facility] If your illness is not likely to be very severe or complicated, you may want to try a walk in clinic. These are popping up all over the country in pharmacies, drugstores, and shopping centers. They're usually staffed by nurse practitioners or physician assistants that have been trained to treat common illnesses and complaints. They're usually fairly quick and inexpensive. However, if you have serious medical issues or chronic medical problems, these are probably not your best option.  No Primary Care Doctor: - Call Health Connect at  [phone] - they can help you locate a primary care doctor that  accepts your insurance, provides certain services, etc. - Physician Referral Service- [phone]  Chronic Pain  Problems: Organization         Address  Phone   Notes  [facility]  [phone] Patients need to be referred by their primary care doctor.   Medication Assistance: Organization         Address  Phone   Notes  [facility] Medication [facility] [address] [place], [place] [address] [phone] --Must be a resident of [facility] -- Must have NO insurance coverage whatsoever (no Medicaid/ Medicare, etc.) -- The pt. MUST have a primary care doctor that directs their care regularly and follows them in the community   MedAssist  [phone]   [employer]  [phone]    Agencies that provide inexpensive medical care: Organization         Address  Phone   Notes  [employer]  [phone]   [facility]    [phone]   [facility] [place], [place] [address] [phone]   [place] [address], [place] [phone]   Planned Parenthood    [phone]   [facility]    [phone]   [place] and [place] [address], [place] Phone:  [phone], Fax:  [phone] Hours of Operation:  9 am - 6 pm, M-F.  Also accepts Medicaid/Medicare and self-pay.  [facility]  [place] [employer], [address], [place] Phone: [phone], Fax: [phone]. Hours of Operation:  8:30 am - 5:30 pm, M-F.  Also accepts Medicaid and self-pay.  [facility] [place] [address], [place] Phone: [phone]   [place], [place], [place] [phone], Ext. [phone] Mondays & Thursdays: 7-9 AM.  First 15 patients are seen on a first come, first serve basis.    [place] Providers:  Organization         Address  Phone   Notes  [facility] [address], [place] [phone] Also  accepts self-pay patients.  [facility] [address], [place]  [phone]   [place], [address], [place] [phone]   [facility] [address], [place] [phone]   Lucianne Lei [address], [place]   [phone] Only accepts [place] Access [place] patients after they have their name applied to their card.   Self-Pay (no insurance) in [facility]:  Organization         Address  Phone   Notes  Sickle Cell Patients, [facility] [place] [phone]   [facility] Urgent Care [place] [phone]   [facility] [place]  [place], [place], [place] [phone]   [facility]/Dr. Osei-Bonsu  [address] [place] or [address], [place] [phone] Phone number for both [place] and [place] locations is the same.  Urgent Medical and [facility] [address], [place] [phone]   [facility] [address] [place] or [address] [phone]   [facility] [address] [place] [phone], phone; [phone], fax Sees patients 1st and 3rd Saturday of every month.  Must not qualify for public or private insurance (i.e. Medicaid, Medicare, [place] Health Choice, Veterans' Benefits)  Household income should be no more than 200% of the poverty level The clinic cannot treat you if you are pregnant or think you are pregnant  Sexually transmitted diseases are not treated at the clinic.    Dental Care: Organization         Address  Phone  Notes  [facility] Department of [facility] [place] [phone] Accepts children up to age 37 who are enrolled in [place] or [place]; pregnant  women with a Medicaid card; and children who have applied for Medicaid or [place] Health Choice, but were declined, whose parents can pay a reduced fee at time of service.  [facility] Department of [facility]  [address], [place] [phone] Accepts children up to age 58 who are enrolled in [place] or [place]; pregnant women with a Medicaid card; and children who have applied for Medicaid or [place] Health Choice, but were declined, whose parents can pay a reduced fee at time of service.  [facility] PRO[facility]  [place] [phone] Patients are seen by appointment only. Walk-ins are not accepted. [place] will see patients 22 years of age and older. Monday - Tuesday (8am-5pm) Most Wednesdays (8:30-5pm) $30 per visit, cash only  [place] Adult  Dental Access PROGRAM  [address], [facility] [phone] Patients are seen by appointment only. Walk-ins are not accepted. [place] will see patients 60 years of age and older. One Wednesday Evening (Monthly: Volunteer Based).  $30 per visit, cash only  [place]  [phone] for adults; Children under age 4, call Graduate Pediatric Dentistry at [phone]. Children aged 49-14, please call [phone] to request a pediatric application.  Dental services are provided in all areas of dental care including fillings, crowns and bridges, complete and partial dentures, implants, gum treatment, root canals, and extractions. Preventive care is also provided. Treatment is provided to both adults and children. Patients are selected via a lottery and there is often a waiting list.   [facility] [address] [place]  [phone] [email or web address]   [facility] [address] [place], [place] [phone], Ext. [phone] Second and Fourth Thursday of each month, opens at 6:30 AM; Clinic ends at 9 AM.  Patients are  seen on a first-come first-served basis, and a limited number are seen during each clinic.   [facility]  [address] Hillard Danker [place], [place] [phone]   Eligibility Requirements You must have lived in [place], [place], or [place] counties for at least the last three months.   You cannot be eligible for state or federal sponsored [employer], including [employer], [place], or [employer].   You generally cannot be eligible for healthcare insurance through your employer.    How to apply: Eligibility screenings are held every Tuesday and Wednesday afternoon from 1:00 pm until 4:00 pm. You do not need an appointment for the interview!  [facility] [address] [place], [place]   [place]  [place] Department  [place]  [phone]    Behavioral Health Resources in the Community: Intensive Outpatient Programs Organization         Address  Phone  Notes  [place]. [address], [place], [place] [phone]   [facility] Outpatient [address], [place], [place]   ADS: Alcohol & Drug Svcs [address], [place], [place]   [place] [address],  [place], [place] or [phone]   Substance Abuse Resources Organization         Address  Phone  Notes  Alcohol and Drug Services  [phone]   [place]  [phone]   [facility] [place]   Chinita Pester  [phone]   Residential & Outpatient Substance Abuse Program  [phone]   Psychological Services Organization         Address  Phone  Notes  [facility]  [place]  [phone]   [place] [address] [place] [phone] or [phone]    Mobile Crisis  Teams Organization         Address  Phone  Notes  [facility], Mobile Crisis Care Unit  [phone]   [facility]  [address] [place], [place]   [place] Levels [address] [place] [phone]    Self-Help/Support Groups Organization         Address  Phone             Notes  Mental  Health Assoc. of [place] - variety of support groups  [place] Call for more information  Narcotics Anonymous (NA), [facility] [address], [employer] [place]  2 meetings at this location   Special educational needs teacher         Address  Phone  Notes  [facility] [place],    [place]  [phone]   [facility]  [address] [place] [phone], [place], [place]   [place], [place] [phone] Admissions: 8am-3pm M-F  Incentives Substance [place] [address],    [place], [place] [phone]   [facility] [address] [place], [place], [place]   [facility] [address],  [place], [place]   [facility] - Intensive Outpatient [address], [place], [place]   [facility] ([place]  [place], [place] [phone] or [phone]   [facility] Services (RTS) [address], [place], [place] Accepts Medicaid  [facility] [place] [address]  [place] [phone] Substance Abuse/Addiction Treatment   [facility] Organization         Address  Phone  Notes  [facility]  [phone]   Domenic Schwab, PhD [address] [place], [place]   [phone] or [phone]   [place], [place] [phone]   [facility] [address], [place], [place] [phone]  Insurance/Medicaid/sponsorship through [place] [facility] and Families [address]                                    [place], [place] [phone] [place] [address], [place] [phone]    Dr. Adele Schilder  [phone]   [facility] Dept. 1) [address], [place] 2) [place] 3)  [address], [place] [phone]  [phone]   [place] [phone] or [phone] (After Hours)

## 2015-08-17 NOTE — ED Notes (Signed)
Patient transported to X-ray 

## 2015-08-17 NOTE — ED Provider Notes (Signed)
CSN: [id number]     Arrival date & time 08/16/15  2324 History   First MD Initiated Contact with Patient 08/17/15 0057     Chief Complaint  Patient presents with  . Chest Pain     (Consider location/radiation/quality/duration/timing/severity/associated sxs/prior Treatment) HPI Comments: She has had 2 months of intermittent anterior chest discomfort, sharp and stabbing in nature that radiates to his neck.  Occasionally this will wake him up from sleep.  He denies any diaphoresis, nausea, shortness of breath.  Has not followed up with a PCP as he does not have one, yet.  He states occasionally when the pain radiates to his neck, he will develop a headache Nothing.  Patient does alleviates the discomfort.  Position does not exacerbate the discomfort  Patient is a 24 y.o. male presenting with chest pain. The history is provided by the patient.  Chest Pain Pain location:  Substernal area and R chest Pain quality: sharp and stabbing   Pain radiates to:  Neck Pain radiates to the back: no   Pain severity:  Moderate Onset quality:  Unable to specify Timing:  Intermittent Progression:  Unchanged Chronicity:  Recurrent Relieved by:  Nothing Worsened by:  Nothing tried Ineffective treatments:  None tried Associated symptoms: anxiety and headache   Associated symptoms: no back pain, no cough, no dizziness, no fever, no nausea, no palpitations, no shortness of breath and not vomiting   Risk factors: obesity   Risk factors: no aortic disease, no coronary artery disease, no diabetes mellitus, no immobilization, no prior DVT/PE and no smoking     Past Medical History  Diagnosis Date  . Asthma    History reviewed. No pertinent past surgical history. History reviewed. No pertinent family history. Social History  Substance Use Topics  . Smoking status: Never Smoker   . Smokeless tobacco: None  . Alcohol Use: No    Review of Systems  Constitutional: Negative for fever and chills.  HENT:  Negative for congestion.   Respiratory: Negative for cough, choking and shortness of breath.   Cardiovascular: Positive for chest pain. Negative for palpitations and leg swelling.  Gastrointestinal: Negative for nausea and vomiting.  Musculoskeletal: Negative for back pain.  Neurological: Positive for headaches. Negative for dizziness.  All other systems reviewed and are negative.     Allergies  Review of patient's allergies indicates no known allergies.  Home Medications   Prior to Admission medications   Medication Sig Start Date End Date Taking? Authorizing Provider  Polyvinyl Alcohol-Povidone (CLEAR EYES ALL SEASONS) 5-6 MG/ML SOLN Apply 2 drops to eye 2 (two) times daily as needed (for allergies).   Yes Historical Provider, MD  hydrOXYzine (ATARAX/VISTARIL) 25 MG tablet Take 1 tablet (25 mg total) by mouth every 6 (six) hours as needed for anxiety. 08/17/15   Junius Creamer, NP  ibuprofen (ADVIL,MOTRIN) 800 MG tablet Take 1 tablet (800 mg total) by mouth every 8 (eight) hours as needed. Patient not taking: Reported on 11/26/2014 05/24/14   Dalia Heading, PA-C  tobramycin (TOBREX) 0.3 % ophthalmic solution Place 1 drop into the right eye every 4 (four) hours. Place one drop in the affected eye every 4 hours for 7 days Patient not taking: Reported on 11/26/2014 05/31/14   Britt Bottom, NP   BP 138/97 mmHg  Pulse 90  Temp(Src) 97.9 F (36.6 C) (Oral)  Resp 18  Ht 5\' 10"  (1.778 m)  Wt 108.863 kg  BMI 34.44 kg/m2  SpO2 100% Physical Exam  Constitutional: He is  oriented to person, place, and time. He appears well-developed and well-nourished.  HENT:  Head: Normocephalic.  Eyes: Pupils are equal, round, and reactive to light.  Neck: Normal range of motion.  Cardiovascular: Normal rate, regular rhythm and intact distal pulses.   Pulmonary/Chest: Effort normal and breath sounds normal. He exhibits no tenderness.  Abdominal: Soft. Bowel sounds are normal. He exhibits no  distension.  Musculoskeletal: Normal range of motion. He exhibits no edema.  Neurological: He is alert and oriented to person, place, and time.  Skin: Skin is warm and dry.  Psychiatric: His mood appears anxious.  Nursing note and vitals reviewed.   ED Course  Procedures (including critical care time) Labs Review Labs Reviewed  BASIC METABOLIC PANEL - Abnormal; Notable for the following:    Glucose, Bld 103 (*)    All other components within normal limits  CBC - Abnormal; Notable for the following:    Hemoglobin 12.6 (*)    HCT 38.3 (*)    All other components within normal limits  I-STAT TROPOININ, ED    Imaging Review No results found. I have personally reviewed and evaluated these images and lab results as part of my medical decision-making.   EKG Interpretation   Date/Time:  Tuesday August 16 2015 23:34:18 EST Ventricular Rate:  88 PR Interval:  171 QRS Duration: 76 QT Interval:  350 QTC Calculation: 423 R Axis:   7 Text Interpretation:  Sinus rhythm ST elevation suggests acute  pericarditis since last tracing no significant change Confirmed by BELFI   MD, MELANIE ([id number]) on 08/16/2015 11:52:37 PM     patient's description of events are consistent with anxiety reassuring today that his EKG is unchanged.  His troponin is 0.  Chest x-ray is normal, he'll be discharged home with a prescription for Vistaril and follow-up.  I will give him the resources to help find a primary care physician  MDM   Final diagnoses:  Chest discomfort  Anxiety         Junius Creamer, NP 08/17/15 [place], DO 08/17/15 [id number]

## 2016-02-03 ENCOUNTER — Encounter (HOSPITAL_COMMUNITY): Payer: Self-pay | Admitting: Oncology

## 2016-02-03 ENCOUNTER — Emergency Department (HOSPITAL_COMMUNITY)
Admission: EM | Admit: 2016-02-03 | Discharge: 2016-02-04 | Disposition: A | Discharge status: Home / Self Care | Payer: Self-pay | Attending: Emergency Medicine | Admitting: Emergency Medicine

## 2016-02-03 DIAGNOSIS — Z5321 Procedure and treatment not carried out due to patient leaving prior to being seen by health care provider: Secondary | ICD-10-CM | POA: Insufficient documentation

## 2016-02-03 DIAGNOSIS — R5383 Other fatigue: Secondary | ICD-10-CM | POA: Insufficient documentation

## 2016-02-03 NOTE — ED Notes (Signed)
Pt started a new job 2 weeks ago and has been feeling fatigued.  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  No other c/o.

## 2016-02-03 NOTE — ED Notes (Signed)
Pt was here to get Dr's note because he was tired, but does not want to wait to see the Dr.

## 2017-07-15 IMAGING — CR DG CHEST 2V
2 series · 2 of 2 positions shown · non-contrast
Comparison: Chest radiograph performed 11/26/2014

CLINICAL DATA: Acute onset of generalized chest pain and headache.
Initial encounter.

EXAM:
CHEST  2 VIEW

[w chest pa]
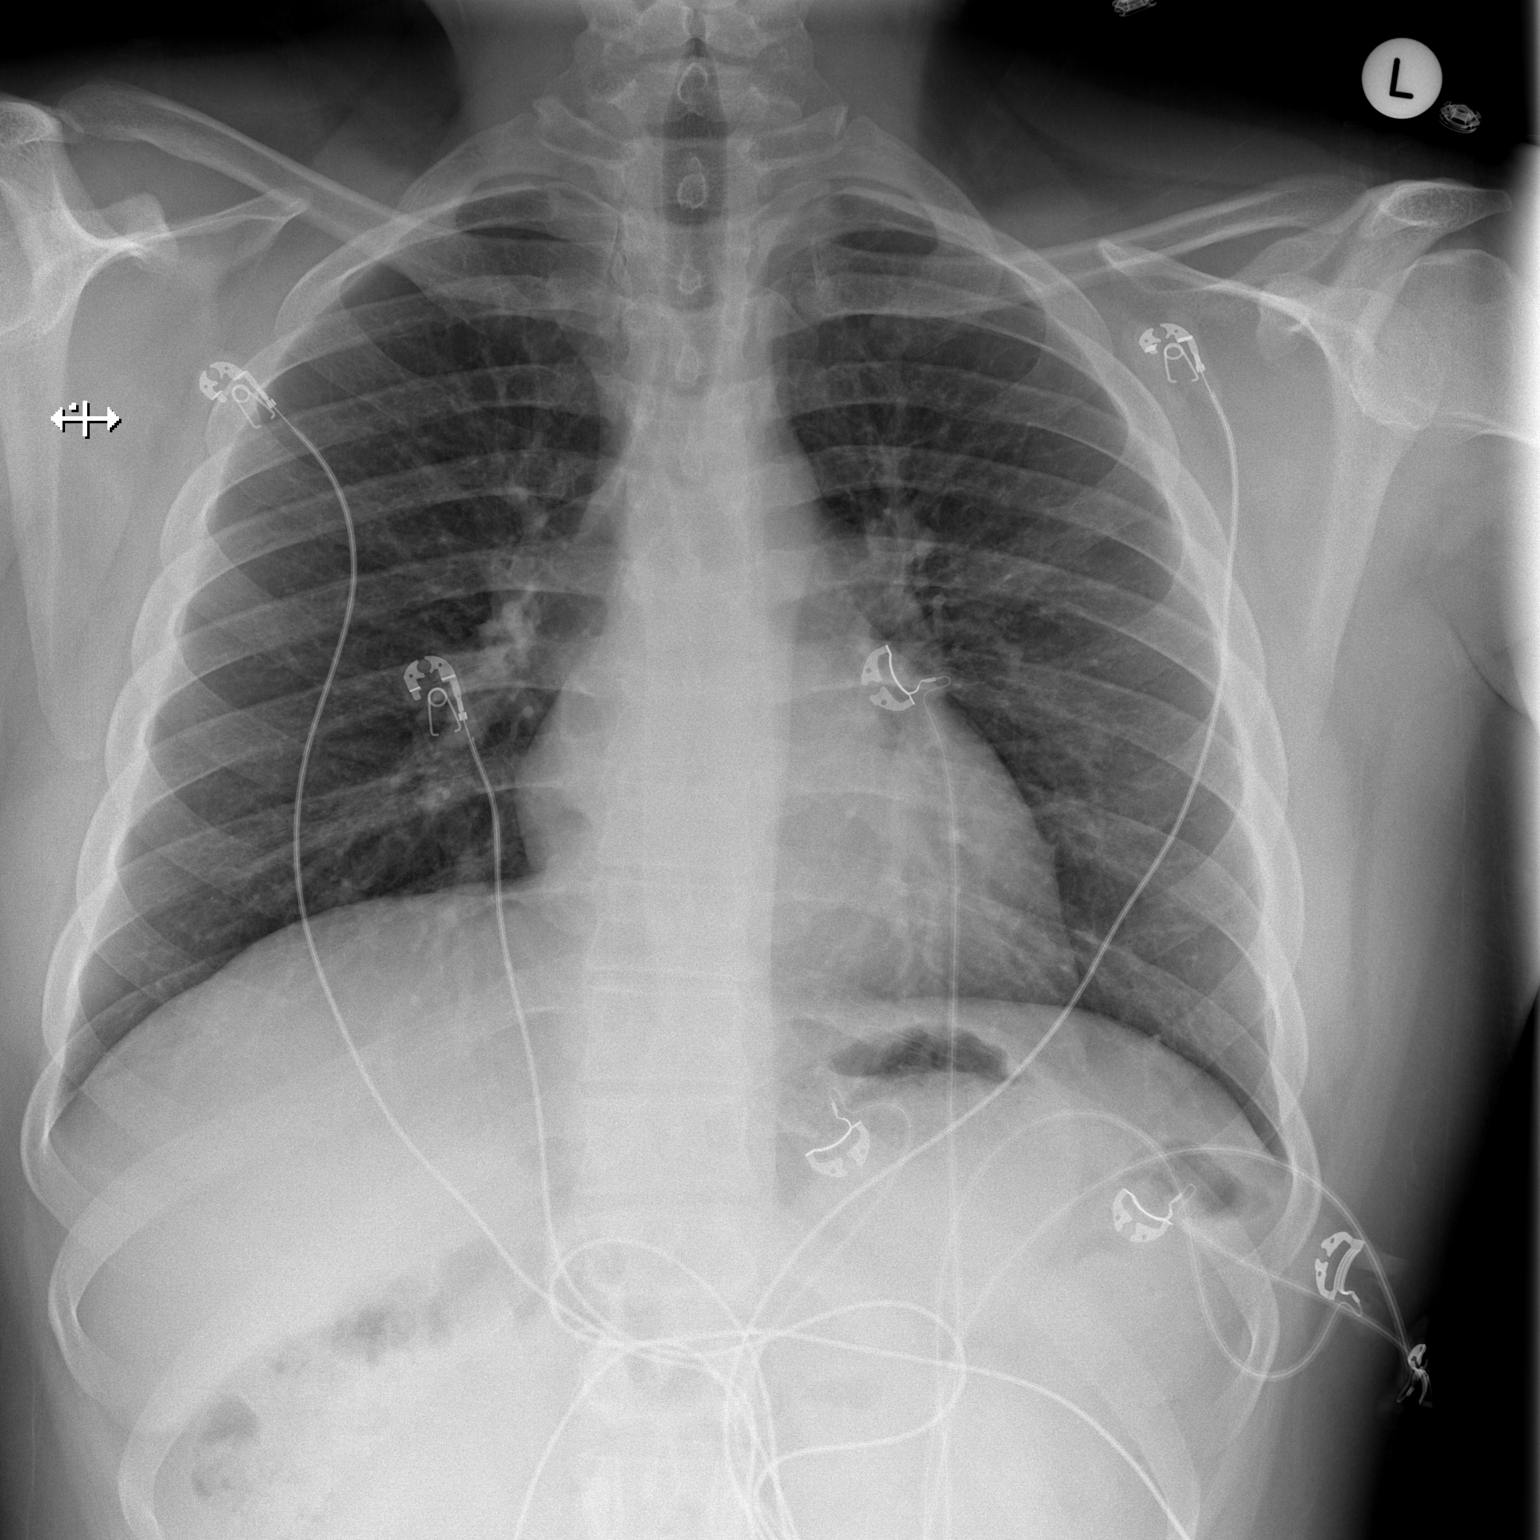

[w chest lat]
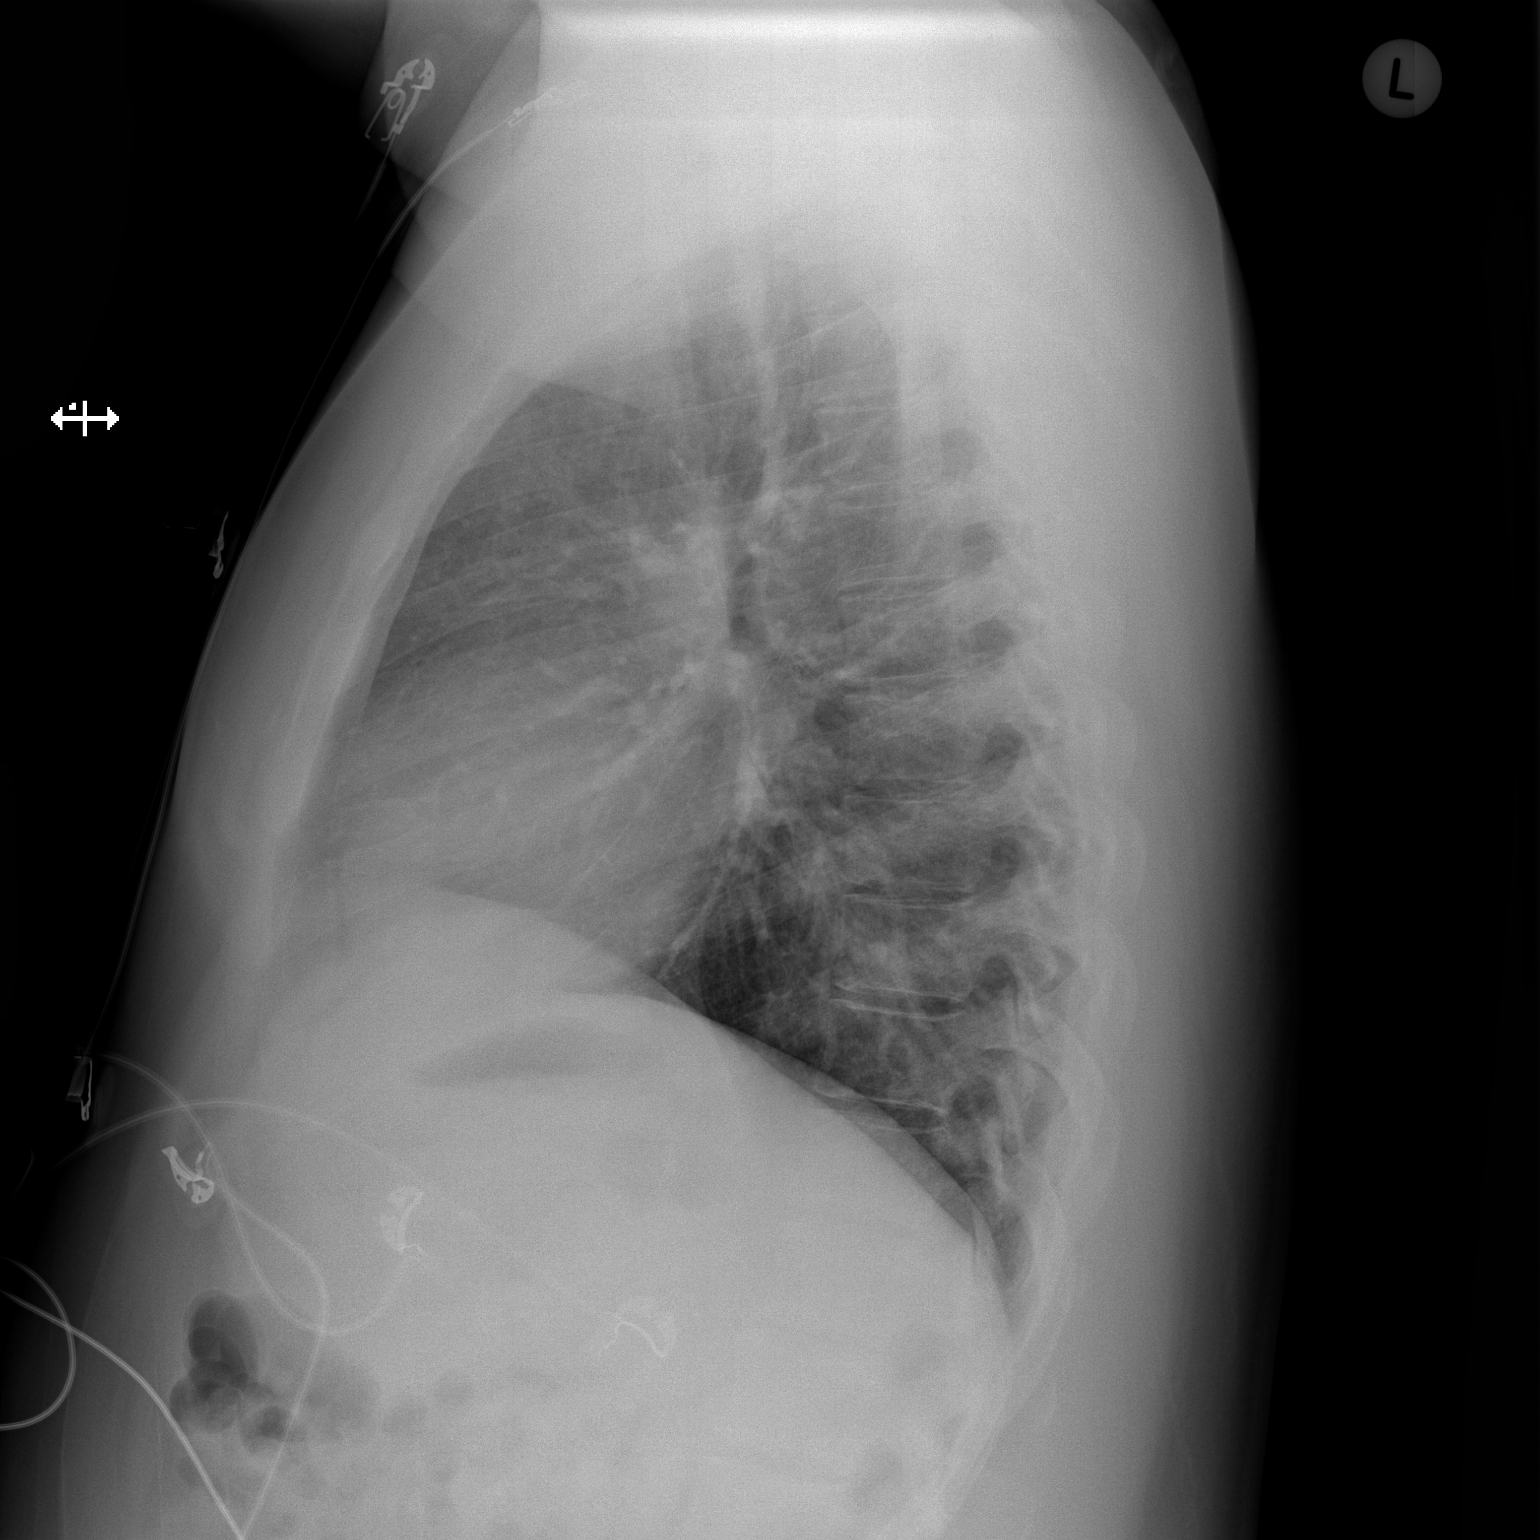

[2 of 2 positions shown; findings below may reference images not displayed]

FINDINGS: The lungs are well-aerated and clear. There is no evidence of focal
opacification, pleural effusion or pneumothorax.

The heart is normal in size; the mediastinal contour is within
normal limits. No acute osseous abnormalities are seen.
IMPRESSION: No acute cardiopulmonary process seen.
